# Patient Record
Sex: Female | Born: 1990 | Race: Black or African American | Hispanic: No | Marital: Single | Smoking: Never smoker
Health system: Southern US, Community
[De-identification: ages and names within clinical notes are randomized; demographics above are authoritative.]

---
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
## 2012-02-22 ENCOUNTER — Emergency Department (INDEPENDENT_AMBULATORY_CARE_PROVIDER_SITE_OTHER)
Admission: EM | Admit: 2012-02-22 | Discharge: 2012-02-22 | Disposition: A | Discharge status: Home / Self Care | Payer: Self-pay | Source: Home / Self Care

## 2012-02-22 ENCOUNTER — Encounter (HOSPITAL_COMMUNITY): Payer: Self-pay

## 2012-02-22 DIAGNOSIS — B373 Candidiasis of vulva and vagina: Secondary | ICD-10-CM

## 2012-02-22 LAB — POCT URINALYSIS DIP (DEVICE)
Glucose, UA: NEGATIVE mg/dL
Ketones, ur: NEGATIVE mg/dL
Protein, ur: 30 mg/dL — AB
Specific Gravity, Urine: 1.025 (ref 1.005–1.030)
Urobilinogen, UA: 0.2 mg/dL (ref 0.0–1.0)

## 2012-02-22 LAB — POCT PREGNANCY, URINE: Preg Test, Ur: NEGATIVE

## 2012-02-22 LAB — WET PREP, GENITAL
Trich, Wet Prep: NONE SEEN
Yeast Wet Prep HPF POC: NONE SEEN

## 2012-02-22 MED ORDER — FLUCONAZOLE 150 MG PO TABS: 150.0000 mg | ORAL_TABLET | Freq: Once | ORAL | Status: DC

## 2012-02-22 NOTE — ED Provider Notes (Signed)
History     CSN: [id number]  Arrival date & time 02/22/12  0856   None     No chief complaint on file.   (Consider location/radiation/quality/duration/timing/severity/associated sxs/prior treatment) Patient is a 21 y.o. female presenting with vaginal discharge. The history is provided by the patient. No language interpreter was used.  Vaginal Discharge This is a new problem. The current episode started more than 2 days ago. The problem occurs constantly. The problem has been gradually worsening. Nothing aggravates the symptoms. She has tried nothing for the symptoms.  Pt recently finished augmentin for a sinus infection.  Pt complains of vaginal irritation and discharge.  Pt is not sexually active  No past medical history on file.  No past surgical history on file.  No family history on file.  History  Substance Use Topics  . Smoking status: Not on file  . Smokeless tobacco: Not on file  . Alcohol Use: Not on file    OB History    No data available      Review of Systems  Genitourinary: Positive for vaginal discharge.  All other systems reviewed and are negative.    Allergies  Review of patient's allergies indicates not on file.  Home Medications  No current outpatient prescriptions on file.  BP 107/73  Pulse 75  Temp 98.2 F (36.8 C) (Oral)  Resp 16  SpO2 99%  Physical Exam  Nursing note and vitals reviewed. Constitutional: She is oriented to person, place, and time. She appears well-developed and well-nourished.  HENT:  Head: Normocephalic and atraumatic.  Eyes: Conjunctivae normal and EOM are normal. Pupils are equal, round, and reactive to light.  Neck: Normal range of motion. Neck supple.  Cardiovascular: Normal rate and normal heart sounds.   Pulmonary/Chest: Effort normal and breath sounds normal.  Abdominal: Soft. Bowel sounds are normal.  Genitourinary: Uterus normal. Vaginal discharge found.  Musculoskeletal: Normal range of motion.    Neurological: She is alert and oriented to person, place, and time. She has normal reflexes.  Skin: Skin is warm.  Psychiatric: She has a normal mood and affect.    ED Course  Procedures (including critical care time)  Labs Reviewed - No data to display No results found.   No diagnosis found.    MDM  discharge looks like yeast,  I will treat with diflucan        Elson Areas, [place] 02/22/12 1003

## 2012-02-22 NOTE — ED Provider Notes (Signed)
Orc.p  Jimmie Molly, MD 02/22/12 1252

## 2012-02-22 NOTE — ED Notes (Signed)
C/o vaginal irritation, stated one week ago states was recently prescribed Amox 875

## 2012-02-23 LAB — GC/CHLAMYDIA PROBE AMP, GENITAL
Chlamydia, DNA Probe: NEGATIVE
GC Probe Amp, Genital: NEGATIVE

## 2012-02-27 ENCOUNTER — Telehealth (HOSPITAL_COMMUNITY): Payer: Self-pay | Admitting: *Deleted

## 2012-02-27 NOTE — ED Notes (Signed)
10/4. GC/Chlamydia neg., Wet prep: few clue cells, WBC's TNTC.  Pt. treated with Diflucan.  Lab shown to Lannie Fields NP and she said to call pt. for clinical improvement. If not improved call in Metrogel 0.75% 1 appl. HS x 5 nights. Vassie Moselle 02/27/2012

## 2012-02-27 NOTE — ED Notes (Signed)
I called and left a message for pt. to call. Pt. needs Rx. of Metrogel if still symptomatic. Katherine Fischer 02/27/2012

## 2012-02-28 ENCOUNTER — Telehealth (HOSPITAL_COMMUNITY): Payer: Self-pay | Admitting: *Deleted

## 2012-03-07 ENCOUNTER — Telehealth (HOSPITAL_COMMUNITY): Payer: Self-pay | Admitting: *Deleted

## 2012-03-12 NOTE — ED Notes (Signed)
Patient returned call from letter message. Denies discomfort at present. Was in formed her results were all negative, but there is a RX available to her if she was still having syx. Pt states she is not having syx now

## 2012-04-05 ENCOUNTER — Emergency Department (INDEPENDENT_AMBULATORY_CARE_PROVIDER_SITE_OTHER)
Admission: EM | Admit: 2012-04-05 | Discharge: 2012-04-05 | Disposition: A | Discharge status: Home / Self Care | Payer: Self-pay | Source: Home / Self Care

## 2012-04-05 ENCOUNTER — Encounter (HOSPITAL_COMMUNITY): Payer: Self-pay | Admitting: Emergency Medicine

## 2012-04-05 DIAGNOSIS — T169XXA Foreign body in ear, unspecified ear, initial encounter: Secondary | ICD-10-CM

## 2012-04-05 NOTE — ED Provider Notes (Signed)
History     CSN: [id number]  Arrival date & time 04/05/12  1918   None     Chief Complaint  Patient presents with  . foregin object in ear     (Consider location/radiation/quality/duration/timing/severity/associated sxs/prior treatment) HPI Comments: 21 year old female was cleaning her ears out with a Q-tip and when she removed the Q-tip the cotton head had dislodged from the stick and remained in the right ear canal. The foreign body was noticeable in the right year and was causing some minor discomfort.   No past medical history on file.  No past surgical history on file.  No family history on file.  History  Substance Use Topics  . Smoking status: Never Smoker   . Smokeless tobacco: Not on file  . Alcohol Use: No    OB History    Grav Para Term Preterm Abortions TAB SAB Ect Mult Living                  Review of Systems  All other systems reviewed and are negative.    Allergies  Review of patient's allergies indicates no known allergies.  Home Medications   Current Outpatient Rx  Name  Route  Sig  Dispense  Refill  . FLUCONAZOLE 150 MG PO TABS   Oral   Take 1 tablet (150 mg total) by mouth once.   1 tablet   1     BP 94/60  Pulse 79  Temp 98.8 F (37.1 C) (Oral)  Resp 16  SpO2 100%  Physical Exam  Constitutional: She is oriented to person, place, and time. She appears well-developed and well-nourished. No distress.  HENT:  Head: Normocephalic and atraumatic.  Mouth/Throat: Oropharynx is clear and moist. No oropharyngeal exudate.       Left TM normal. Right TM partially obstructed to 2 white cotton foreign body.  Neck: Normal range of motion. Neck supple.  Pulmonary/Chest: Effort normal.  Musculoskeletal: Normal range of motion.  Neurological: She is alert and oriented to person, place, and time.  Skin: Skin is warm and dry.  Psychiatric: She has a normal mood and affect.    ED Course  Procedures (including critical care time)  Labs  Reviewed - No data to display No results found.   1. Foreign body in ear       MDM  Irrigated the right ear. Post irrigation the ear canal is clear of foreign body has been removed and the TM is intact. No hematoma tympanic however there is some erythema to 2 irrigation. She says she feels a lot better has no pain and she is ready to go.        Hayden Rasmussen, NP 04/05/12 2201

## 2012-04-05 NOTE — ED Notes (Signed)
Reports today when she was digging in ear with q-tip the cotton piece broke off.   Denies pain and any drainage.

## 2012-04-06 NOTE — ED Provider Notes (Signed)
Medical screening examination/treatment/procedure(s) were performed by resident physician or non-physician practitioner and as supervising physician I was immediately available for consultation/collaboration.   Alliyah Roesler DOUGLAS MD.    Jameria Bradway D Jionni Helming, MD 04/06/12 0919 

## 2012-05-24 ENCOUNTER — Emergency Department (HOSPITAL_COMMUNITY)
Admission: EM | Admit: 2012-05-24 | Discharge: 2012-05-24 | Disposition: A | Discharge status: Home / Self Care | Payer: Self-pay | Attending: Emergency Medicine | Admitting: Emergency Medicine

## 2012-05-24 ENCOUNTER — Encounter (HOSPITAL_COMMUNITY): Payer: Self-pay

## 2012-05-24 DIAGNOSIS — J111 Influenza due to unidentified influenza virus with other respiratory manifestations: Secondary | ICD-10-CM

## 2012-05-24 DIAGNOSIS — IMO0001 Reserved for inherently not codable concepts without codable children: Secondary | ICD-10-CM | POA: Insufficient documentation

## 2012-05-24 DIAGNOSIS — R509 Fever, unspecified: Secondary | ICD-10-CM | POA: Insufficient documentation

## 2012-05-24 DIAGNOSIS — J3489 Other specified disorders of nose and nasal sinuses: Secondary | ICD-10-CM | POA: Insufficient documentation

## 2012-05-24 DIAGNOSIS — R51 Headache: Secondary | ICD-10-CM | POA: Insufficient documentation

## 2012-05-24 DIAGNOSIS — R52 Pain, unspecified: Secondary | ICD-10-CM | POA: Insufficient documentation

## 2012-05-24 DIAGNOSIS — R112 Nausea with vomiting, unspecified: Secondary | ICD-10-CM | POA: Insufficient documentation

## 2012-05-24 DIAGNOSIS — R197 Diarrhea, unspecified: Secondary | ICD-10-CM | POA: Insufficient documentation

## 2012-05-24 DIAGNOSIS — Z3202 Encounter for pregnancy test, result negative: Secondary | ICD-10-CM | POA: Insufficient documentation

## 2012-05-24 LAB — BASIC METABOLIC PANEL
CO2: 20 mEq/L (ref 19–32)
Chloride: 98 mEq/L (ref 96–112)
GFR calc Af Amer: >90 mL/min (ref >90)
Potassium: 3.6 mEq/L (ref 3.5–5.1)

## 2012-05-24 LAB — CBC WITH DIFFERENTIAL/PLATELET
Basophils Absolute: 0 10*3/uL (ref 0.0–0.1)
Basophils Relative: 0 % (ref 0–1)
HCT: 37.3 % (ref 36.0–46.0)
Hemoglobin: 12 g/dL (ref 12.0–15.0)
Lymphocytes Relative: 4 % — ABNORMAL LOW (ref 12–46)
MCHC: 32.2 g/dL (ref 30.0–36.0)
Monocytes Absolute: 0.3 10*3/uL (ref 0.1–1.0)
Neutro Abs: 8.2 10*3/uL — ABNORMAL HIGH (ref 1.7–7.7)
Neutrophils Relative %: 93 % — ABNORMAL HIGH (ref 43–77)
RDW: 13.6 % (ref 11.5–15.5)
WBC: 8.9 10*3/uL (ref 4.0–10.5)

## 2012-05-24 LAB — URINALYSIS, ROUTINE W REFLEX MICROSCOPIC
Glucose, UA: NEGATIVE mg/dL
Leukocytes, UA: NEGATIVE
Nitrite: NEGATIVE
Specific Gravity, Urine: 1.033 — ABNORMAL HIGH (ref 1.005–1.030)
pH: 6 (ref 5.0–8.0)

## 2012-05-24 LAB — POCT PREGNANCY, URINE: Preg Test, Ur: NEGATIVE

## 2012-05-24 LAB — URINE MICROSCOPIC-ADD ON

## 2012-05-24 LAB — LIPASE, BLOOD: Lipase: 16 U/L (ref 11–59)

## 2012-05-24 MED ORDER — PROMETHAZINE HCL 25 MG PO TABS
25.0000 mg | ORAL_TABLET | Freq: Four times a day (QID) | ORAL | Status: DC | PRN
Start: 2012-05-24 — End: 2013-05-29

## 2012-05-24 MED ORDER — ONDANSETRON HCL 4 MG/2ML IJ SOLN
4.0000 mg | Freq: Once | INTRAMUSCULAR | Status: AC
  Administered 2012-05-24: 4 mg via INTRAVENOUS
  Filled 2012-05-24: qty 2

## 2012-05-24 MED ORDER — DIPHENOXYLATE-ATROPINE 2.5-0.025 MG PO TABS: 1.0000 | ORAL_TABLET | Freq: Four times a day (QID) | ORAL | Status: DC | PRN

## 2012-05-24 MED ORDER — SODIUM CHLORIDE 0.9 % IV BOLUS (SEPSIS)
1000.0000 mL | Freq: Once | INTRAVENOUS | Status: AC
  Administered 2012-05-24: 1000 mL via INTRAVENOUS

## 2012-05-24 MED ORDER — IBUPROFEN 800 MG PO TABS: 800.0000 mg | ORAL_TABLET | Freq: Three times a day (TID) | ORAL | Status: DC

## 2012-05-24 NOTE — ED Provider Notes (Signed)
History     CSN: [id number]  Arrival date & time 05/24/12  1002   First MD Initiated Contact with Patient 05/24/12 1202      No chief complaint on file.   (Consider location/radiation/quality/duration/timing/severity/associated sxs/prior treatment) HPI Comments: Patient comes to the ER for evaluation of flulike symptoms. Patient started to get sick about 3 or 4 days ago. She says initially she had sinus congestion. She then developed headache and cough. She has been experiencing fever, chills and sweats. Last night, however, she developed nausea, vomiting and diarrhea. She has taken over-the-counter cold and flu medications without any relief. She reports that the nausea and vomiting has now resolved, she has drank some ginger ale but her stomach is still queasy.   History reviewed. No pertinent past medical history.  History reviewed. No pertinent past surgical history.  No family history on file.  History  Substance Use Topics  . Smoking status: Never Smoker   . Smokeless tobacco: Not on file  . Alcohol Use: No    OB History    Grav Para Term Preterm Abortions TAB SAB Ect Mult Living                  Review of Systems  Constitutional: Positive for fever and chills.  HENT: Positive for congestion.   Respiratory: Positive for cough.   Gastrointestinal: Positive for nausea, vomiting and diarrhea.  Musculoskeletal: Positive for myalgias.  Neurological: Positive for headaches.  All other systems reviewed and are negative.    Allergies  Review of patient's allergies indicates no known allergies.  Home Medications  No current outpatient prescriptions on file.  BP 120/66  Pulse 115  Temp 99.7 F (37.6 C) (Oral)  Resp 20  SpO2 99%  LMP 05/06/2012  Physical Exam  Constitutional: She is oriented to person, place, and time. She appears well-developed and well-nourished. No distress.  HENT:  Head: Normocephalic and atraumatic.  Right Ear: Hearing normal.  Nose:  Nose normal.  Mouth/Throat: Oropharynx is clear and moist and mucous membranes are normal.  Eyes: Conjunctivae normal and EOM are normal. Pupils are equal, round, and reactive to light.  Neck: Normal range of motion. Neck supple.  Cardiovascular: Normal rate, regular rhythm, S1 normal and S2 normal.  Exam reveals no gallop and no friction rub.   No murmur heard. Pulmonary/Chest: Effort normal and breath sounds normal. No respiratory distress. She exhibits no tenderness.  Abdominal: Soft. Normal appearance and bowel sounds are normal. There is no hepatosplenomegaly. There is no tenderness. There is no rebound, no guarding, no tenderness at McBurney's point and negative Murphy's sign. No hernia.  Musculoskeletal: Normal range of motion.  Neurological: She is alert and oriented to person, place, and time. She has normal strength. No cranial nerve deficit or sensory deficit. Coordination normal. GCS eye subscore is 4. GCS verbal subscore is 5. GCS motor subscore is 6.  Skin: Skin is warm, dry and intact. No rash noted. No cyanosis.  Psychiatric: She has a normal mood and affect. Her speech is normal and behavior is normal. Thought content normal.    ED Course  Procedures (including critical care time)  Labs Reviewed  CBC WITH DIFFERENTIAL - Abnormal; Notable for the following:    Neutrophils Relative 93 (*)     Neutro Abs 8.2 (*)     Lymphocytes Relative 4 (*)     Lymphs Abs 0.4 (*)     All other components within normal limits  BASIC METABOLIC PANEL - Abnormal;  Notable for the following:    Glucose, Bld 121 (*)     All other components within normal limits  URINALYSIS, ROUTINE W REFLEX MICROSCOPIC - Abnormal; Notable for the following:    Specific Gravity, Urine 1.033 (*)     Hgb urine dipstick MODERATE (*)     Ketones, ur 40 (*)     All other components within normal limits  LIPASE, BLOOD  POCT PREGNANCY, URINE  URINE MICROSCOPIC-ADD ON   No results found.   1. Flu syndrome        MDM  Patient presents to the ER for evaluation of flu symptoms. This has been sick for 4 or 5 days. She started with upper respiratory infection symptoms and now has developed nausea, vomiting and diarrhea. Patient reports weakness and inability to hold anything down. She was hydrated with a liter normal saline and is feeling better. Labs were unremarkable. Examination was unremarkable. Patient will be discharged with continued symptomatic treatment.        Gilda Crease, MD 05/24/12 1336

## 2012-05-24 NOTE — ED Notes (Signed)
Family at bedside. 

## 2012-05-24 NOTE — ED Notes (Signed)
Pt presents with 3-4 day h/o generalizes body aches, sinus pressure and headache.  Pt reports epigastric pain with nausea, vomiting and diarrhea that began last night. +chills and sweats;  Alka seltzer, tylenol cold taken without relief.

## 2013-02-21 ENCOUNTER — Emergency Department (INDEPENDENT_AMBULATORY_CARE_PROVIDER_SITE_OTHER)
Admission: EM | Admit: 2013-02-21 | Discharge: 2013-02-21 | Disposition: A | Discharge status: Home / Self Care | Payer: Medicaid Other | Source: Home / Self Care | Attending: Emergency Medicine | Admitting: Emergency Medicine

## 2013-02-21 ENCOUNTER — Encounter (HOSPITAL_COMMUNITY): Payer: Self-pay | Admitting: Emergency Medicine

## 2013-02-21 DIAGNOSIS — J069 Acute upper respiratory infection, unspecified: Secondary | ICD-10-CM

## 2013-02-21 MED ORDER — SALINE SPRAY 0.65 % NA SOLN: 1.0000 | NASAL | Status: DC | PRN

## 2013-02-21 MED ORDER — BENZONATATE 100 MG PO CAPS: 100.0000 mg | ORAL_CAPSULE | Freq: Three times a day (TID) | ORAL | Status: DC

## 2013-02-21 NOTE — ED Provider Notes (Signed)
Medical screening examination/treatment/procedure(s) were performed by non-physician practitioner and as supervising physician I was immediately available for consultation/collaboration.  Rigby Swamy, M.D.  Dorraine Ellender C Keymon Mcelroy, MD 02/21/13 1509 

## 2013-02-21 NOTE — ED Provider Notes (Signed)
CSN: [id number]     Arrival date & time 02/21/13  1010 History   First MD Initiated Contact with Patient 02/21/13 1118     No chief complaint on file.  (Consider location/radiation/quality/duration/timing/severity/associated sxs/prior Treatment) Patient is a 22 y.o. female presenting with URI. The history is provided by the patient.  URI Presenting symptoms: congestion, cough, rhinorrhea and sore throat   Presenting symptoms: no ear pain and no fever   Severity:  Moderate Onset quality:  Gradual Duration:  5 days Timing:  Constant Progression:  Unchanged Chronicity:  New Relieved by:  Nothing Exacerbated by: lying down- can't because of coughing. Ineffective treatments:  OTC medications Associated symptoms: no sinus pain, no swollen glands and no wheezing     History reviewed. No pertinent past medical history. History reviewed. No pertinent past surgical history. History reviewed. No pertinent family history. History  Substance Use Topics  . Smoking status: Never Smoker   . Smokeless tobacco: Not on file  . Alcohol Use: No   OB History   Grav Para Term Preterm Abortions TAB SAB Ect Mult Living                 Review of Systems  Constitutional: Negative for fever and chills.  HENT: Positive for congestion, sore throat, rhinorrhea and postnasal drip. Negative for ear pain and sinus pressure.   Respiratory: Positive for cough. Negative for shortness of breath and wheezing.     Allergies  Review of patient's allergies indicates no known allergies.  Home Medications   Current Outpatient Rx  Name  Route  Sig  Dispense  Refill  . benzonatate (TESSALON) 100 MG capsule   Oral   Take 1 capsule (100 mg total) by mouth every 8 (eight) hours.   21 capsule   0   . diphenoxylate-atropine (LOMOTIL) 2.5-0.025 MG per tablet   Oral   Take 1 tablet by mouth 4 (four) times daily as needed for diarrhea or loose stools.   30 tablet   0   . ibuprofen (ADVIL,MOTRIN) 800 MG  tablet   Oral   Take 1 tablet (800 mg total) by mouth 3 (three) times daily.   21 tablet   0   . promethazine (PHENERGAN) 25 MG tablet   Oral   Take 1 tablet (25 mg total) by mouth every 6 (six) hours as needed for nausea.   30 tablet   0   . sodium chloride (OCEAN) 0.65 % SOLN nasal spray   Nasal   Place 1 spray into the nose as needed for congestion.   1 Bottle   0    BP 119/55  Pulse 111  Temp(Src) 98.5 F (36.9 C) (Oral)  Resp 18  SpO2 100% Physical Exam  Constitutional: She appears well-developed and well-nourished. She appears ill. No distress.  HENT:  Right Ear: Tympanic membrane, external ear and ear canal normal.  Left Ear: Tympanic membrane, external ear and ear canal normal.  Nose: Mucosal edema and rhinorrhea present. Right sinus exhibits no maxillary sinus tenderness and no frontal sinus tenderness. Left sinus exhibits no maxillary sinus tenderness and no frontal sinus tenderness.  Mouth/Throat: Oropharynx is clear and moist and mucous membranes are normal.  Cardiovascular: Normal rate and regular rhythm.   Pt is not tachycardic on my exam  Pulmonary/Chest: Effort normal and breath sounds normal.  No coughing observed during exam  Lymphadenopathy:       Head (right side): No submental, no submandibular and no tonsillar adenopathy present.  Head (left side): No submental, no submandibular and no tonsillar adenopathy present.    She has no cervical adenopathy.    ED Course  Procedures (including critical care time) Labs Review Labs Reviewed - No data to display Imaging Review No results found.  MDM   1. URI (upper respiratory infection)   suggested afrin nasal spray. Rx saline nasal spray prn #1 bottle. Rx tessalon perles 100mg  TID prn cough #21     Cathlyn Parsons, NP 02/21/13 1124

## 2013-02-21 NOTE — ED Notes (Signed)
Cough and runny nose for onset last week friday

## 2013-03-25 ENCOUNTER — Emergency Department (INDEPENDENT_AMBULATORY_CARE_PROVIDER_SITE_OTHER)
Admission: EM | Admit: 2013-03-25 | Discharge: 2013-03-25 | Disposition: A | Discharge status: Home / Self Care | Payer: Medicaid Other | Source: Home / Self Care | Attending: Family Medicine | Admitting: Family Medicine

## 2013-03-25 ENCOUNTER — Encounter (HOSPITAL_COMMUNITY): Payer: Self-pay | Admitting: Emergency Medicine

## 2013-03-25 DIAGNOSIS — J020 Streptococcal pharyngitis: Secondary | ICD-10-CM

## 2013-03-25 MED ORDER — AMOXICILLIN 875 MG PO TABS: 875.0000 mg | ORAL_TABLET | Freq: Two times a day (BID) | ORAL | Status: DC

## 2013-03-25 NOTE — ED Provider Notes (Signed)
Medical screening examination/treatment/procedure(s) were performed by a resident physician or non-physician practitioner and as the supervising physician I was immediately available for consultation/collaboration.  Clementeen Graham, MD    Rodolph Bong, MD 03/25/13 [phone]

## 2013-03-25 NOTE — ED Notes (Signed)
Sore throat, headaches, body aches, shivers, onset Saturday 11/1

## 2013-03-25 NOTE — ED Provider Notes (Signed)
CSN: [id number]     Arrival date & time 03/25/13  1027 History   First MD Initiated Contact with Patient 03/25/13 1135     Chief Complaint  Patient presents with  . Sore Throat   (Consider location/radiation/quality/duration/timing/severity/associated sxs/prior Treatment) Patient is a 22 y.o. female presenting with pharyngitis. The history is provided by the patient.  Sore Throat This is a new problem. The current episode started 2 days ago. The problem occurs constantly. The problem has been gradually worsening. Associated symptoms include headaches. The symptoms are aggravated by swallowing. Nothing relieves the symptoms. She has tried nothing for the symptoms.    History reviewed. No pertinent past medical history. History reviewed. No pertinent past surgical history. History reviewed. No pertinent family history. History  Substance Use Topics  . Smoking status: Never Smoker   . Smokeless tobacco: Not on file  . Alcohol Use: No   OB History   Grav Para Term Preterm Abortions TAB SAB Ect Mult Living                 Review of Systems  Constitutional: Positive for fever and chills.  HENT: Positive for sore throat. Negative for congestion and postnasal drip.   Neurological: Positive for headaches.    Allergies  Review of patient's allergies indicates no known allergies.  Home Medications   Current Outpatient Rx  Name  Route  Sig  Dispense  Refill  . acetaminophen (TYLENOL) 325 MG tablet   Oral   Take 650 mg by mouth every 6 (six) hours as needed for pain.         Marland Kitchen amoxicillin (AMOXIL) 875 MG tablet   Oral   Take 1 tablet (875 mg total) by mouth 2 (two) times daily.   20 tablet   0   . benzonatate (TESSALON) 100 MG capsule   Oral   Take 1 capsule (100 mg total) by mouth every 8 (eight) hours.   21 capsule   0   . dextromethorphan 15 MG/5ML syrup   Oral   Take 10 mLs by mouth 4 (four) times daily as needed for cough.         . diphenoxylate-atropine  (LOMOTIL) 2.5-0.025 MG per tablet   Oral   Take 1 tablet by mouth 4 (four) times daily as needed for diarrhea or loose stools.   30 tablet   0   . ibuprofen (ADVIL,MOTRIN) 800 MG tablet   Oral   Take 1 tablet (800 mg total) by mouth 3 (three) times daily.   21 tablet   0   . promethazine (PHENERGAN) 25 MG tablet   Oral   Take 1 tablet (25 mg total) by mouth every 6 (six) hours as needed for nausea.   30 tablet   0   . sodium chloride (OCEAN) 0.65 % SOLN nasal spray   Nasal   Place 1 spray into the nose as needed for congestion.   1 Bottle   0    BP 120/54  Pulse 113  Temp(Src) 100.4 F (38 C) (Oral)  Resp 16  SpO2 100%  LMP 03/18/2013 Physical Exam  Constitutional: She appears well-developed and well-nourished. No distress.  HENT:  Right Ear: Tympanic membrane, external ear and ear canal normal.  Left Ear: Tympanic membrane, external ear and ear canal normal.  Nose: No mucosal edema.  Mouth/Throat: Oropharyngeal exudate, posterior oropharyngeal edema and posterior oropharyngeal erythema present.  Cardiovascular: Normal rate and regular rhythm.   Pulmonary/Chest: Effort normal and breath sounds  normal.    ED Course  Procedures (including critical care time) Labs Review Labs Reviewed  POCT RAPID STREP A ([facility] ONLY) - Abnormal; Notable for the following:    Streptococcus, Group A Screen (Direct) POSITIVE (*)    All other components within normal limits   Imaging Review No results found.  EKG Interpretation     Ventricular Rate:    PR Interval:    QRS Duration:   QT Interval:    QTC Calculation:   R Axis:     Text Interpretation:              MDM   1. Strep pharyngitis   strep screen pos. Rx amoxicillin 875mg  po BID #20.      Cathlyn Parsons, NP 03/25/13 1150

## 2013-05-29 ENCOUNTER — Encounter (HOSPITAL_COMMUNITY): Payer: Self-pay | Admitting: Emergency Medicine

## 2013-05-29 ENCOUNTER — Emergency Department (INDEPENDENT_AMBULATORY_CARE_PROVIDER_SITE_OTHER)
Admission: EM | Admit: 2013-05-29 | Discharge: 2013-05-29 | Disposition: A | Discharge status: Home / Self Care | Payer: Medicaid Other | Source: Home / Self Care | Attending: Family Medicine | Admitting: Family Medicine

## 2013-05-29 DIAGNOSIS — R21 Rash and other nonspecific skin eruption: Secondary | ICD-10-CM

## 2013-05-29 MED ORDER — PREDNISONE 5 MG PO KIT
PACK | ORAL | Status: DC
Start: 2013-05-29 — End: 2017-04-10

## 2013-05-29 MED ORDER — TRIAMCINOLONE 0.1 % CREAM:EUCERIN CREAM 1:1: 1.0000 "application " | TOPICAL_CREAM | Freq: Two times a day (BID) | CUTANEOUS | Status: DC | PRN

## 2013-05-29 NOTE — ED Provider Notes (Signed)
Katherine Fischer is a 23 y.o. female who presents to Urgent Care today for rash present for the past 2-3 days. The rash spread over both upper lower extremities and across her trunk. She describes small pruritic erythematous papules. She denies any new medications or detergents soaps etc. She denies any mucocutaneous involvement, or flulike illness. She's tried some hydrocortisone intermittently which didn't seem to help much. Additionally she's been taking Benadryl at night to help sleep which helps. No fevers or chills nausea vomiting or diarrhea. She has never had anything like this before.   History reviewed. No pertinent past medical history. History  Substance Use Topics  . Smoking status: Never Smoker   . Smokeless tobacco: Not on file  . Alcohol Use: No   ROS as above Medications reviewed. No current facility-administered medications for this encounter.   Current Outpatient Prescriptions  Medication Sig Dispense Refill  . acetaminophen (TYLENOL) 325 MG tablet Take 650 mg by mouth every 6 (six) hours as needed for pain.      . PredniSONE 5 MG KIT 12 day kit po  1 kit  0  . Triamcinolone Acetonide (TRIAMCINOLONE 0.1 % CREAM : EUCERIN) CREA Apply 1 application topically 2 (two) times daily as needed for rash. Disp 1 pound jar  1 each  1  . [DISCONTINUED] promethazine (PHENERGAN) 25 MG tablet Take 1 tablet (25 mg total) by mouth every 6 (six) hours as needed for nausea.  30 tablet  0  . [DISCONTINUED] sodium chloride (OCEAN) 0.65 % SOLN nasal spray Place 1 spray into the nose as needed for congestion.  1 Bottle  0    Exam:  BP 109/61  Pulse 74  Temp(Src) 98.5 F (36.9 C) (Oral)  Resp 18  SpO2 98% Gen: Well NAD HEENT: EOMI,  MMM, no mucocutaneous involvement Lungs: Normal work of breathing. CTABL Heart: RRR no MRG Exts:, warm and well perfused.  Skin: Groups of very small erythematous papules with some excoriation on the extremities and trunk. They tend to congregate along the  forearms and stretch marks of the upper extremities back and lower extremities. She does not have much involvement of her face. Nontender and blanchable.    Assessment and Plan: 23 y.o. female with rash. Unclear etiology. I think this is likely contact dermatitis however it is impossible to tell. This may be some intrinsic issue.  As she has significant involvement and has failed over-the-counter hydrocortisone I will use a prednisone taper. I will use 5 mg 12 day prednisone dose pack.  Additionally we'll treat with topical Eucerin and triamcinolone cream. We'll use Gold Bond Itch and Benadryl for symptomatic control temporarily.  Followup back here or with dermatology if not improving.    Discussed warning signs or symptoms. Please see discharge instructions. Patient expresses understanding.    Gregor Hams, MD 05/29/13 [phone]

## 2013-05-29 NOTE — ED Notes (Signed)
Rash on hands, itching back of legs, neck since Monday; no one else is affected

## 2013-05-29 NOTE — Discharge Instructions (Signed)
Thank you for coming in today. Take prednisone as directed for 12 days.  Use the tub of cream twice daily for itching.  Take 1-2 benadryl at night for itching as needed.  Apply Gold Bond Itch as needed for temporary control of itching.  Call or go to the emergency room if you get worse, have trouble breathing, have chest pains, or palpitations.   Contact Dermatitis Contact dermatitis is a reaction to certain substances that touch the skin. Contact dermatitis can be either irritant contact dermatitis or allergic contact dermatitis. Irritant contact dermatitis does not require previous exposure to the substance for a reaction to occur.Allergic contact dermatitis only occurs if you have been exposed to the substance before. Upon a repeat exposure, your body reacts to the substance.  CAUSES  Many substances can cause contact dermatitis. Irritant dermatitis is most commonly caused by repeated exposure to mildly irritating substances, such as:  Makeup.  Soaps.  Detergents.  Bleaches.  Acids.  Metal salts, such as nickel. Allergic contact dermatitis is most commonly caused by exposure to:  Poisonous plants.  Chemicals (deodorants, shampoos).  Jewelry.  Latex.  Neomycin in triple antibiotic cream.  Preservatives in products, including clothing. SYMPTOMS  The area of skin that is exposed may develop:  Dryness or flaking.  Redness.  Cracks.  Itching.  Pain or a burning sensation.  Blisters. With allergic contact dermatitis, there may also be swelling in areas such as the eyelids, mouth, or genitals.  DIAGNOSIS  Your caregiver can usually tell what the problem is by doing a physical exam. In cases where the cause is uncertain and an allergic contact dermatitis is suspected, a patch skin test may be performed to help determine the cause of your dermatitis. TREATMENT Treatment includes protecting the skin from further contact with the irritating substance by avoiding that  substance if possible. Barrier creams, powders, and gloves may be helpful. Your caregiver may also recommend:  Steroid creams or ointments applied 2 times daily. For best results, soak the rash area in cool water for 20 minutes. Then apply the medicine. Cover the area with a plastic wrap. You can store the steroid cream in the refrigerator for a "chilly" effect on your rash. That may decrease itching. Oral steroid medicines may be needed in more severe cases.  Antibiotics or antibacterial ointments if a skin infection is present.  Antihistamine lotion or an antihistamine taken by mouth to ease itching.  Lubricants to keep moisture in your skin.  Burow's solution to reduce redness and soreness or to dry a weeping rash. Mix one packet or tablet of solution in 2 cups cool water. Dip a clean washcloth in the mixture, wring it out a bit, and put it on the affected area. Leave the cloth in place for 30 minutes. Do this as often as possible throughout the day.  Taking several cornstarch or baking soda baths daily if the area is too large to cover with a washcloth. Harsh chemicals, such as alkalis or acids, can cause skin damage that is like a burn. You should flush your skin for 15 to 20 minutes with cold water after such an exposure. You should also seek immediate medical care after exposure. Bandages (dressings), antibiotics, and pain medicine may be needed for severely irritated skin.  HOME CARE INSTRUCTIONS  Avoid the substance that caused your reaction.  Keep the area of skin that is affected away from hot water, soap, sunlight, chemicals, acidic substances, or anything else that would irritate your skin.  Do not scratch the rash. Scratching may cause the rash to become infected.  You may take cool baths to help stop the itching.  Only take over-the-counter or prescription medicines as directed by your caregiver.  See your caregiver for follow-up care as directed to make sure your skin is  healing properly. SEEK MEDICAL CARE IF:   Your condition is not better after 3 days of treatment.  You seem to be getting worse.  You see signs of infection such as swelling, tenderness, redness, soreness, or warmth in the affected area.  You have any problems related to your medicines. Document Released: 05/06/2000 Document Revised: 08/01/2011 Document Reviewed: 10/12/2010 [facility] Patient Information 2014 [place], [place].

## 2015-08-13 ENCOUNTER — Ambulatory Visit: Payer: Medicaid Other | Attending: Internal Medicine

## 2015-11-17 ENCOUNTER — Ambulatory Visit: Payer: Medicaid Other | Admitting: Orthopaedic Surgery

## 2015-11-19 ENCOUNTER — Other Ambulatory Visit (HOSPITAL_COMMUNITY): Payer: Self-pay | Admitting: Sports Medicine

## 2015-11-19 DIAGNOSIS — M25561 Pain in right knee: Secondary | ICD-10-CM

## 2015-11-30 ENCOUNTER — Ambulatory Visit (HOSPITAL_COMMUNITY)
Admission: RE | Admit: 2015-11-30 | Discharge: 2015-11-30 | Disposition: A | Discharge status: Home / Self Care | Payer: Medicaid Other | Source: Ambulatory Visit | Attending: Sports Medicine | Admitting: Sports Medicine

## 2015-11-30 DIAGNOSIS — M25461 Effusion, right knee: Secondary | ICD-10-CM | POA: Insufficient documentation

## 2015-11-30 DIAGNOSIS — M25561 Pain in right knee: Secondary | ICD-10-CM | POA: Insufficient documentation

## 2015-11-30 DIAGNOSIS — S76111A Strain of right quadriceps muscle, fascia and tendon, initial encounter: Secondary | ICD-10-CM | POA: Insufficient documentation

## 2015-11-30 DIAGNOSIS — S83014A Lateral dislocation of right patella, initial encounter: Secondary | ICD-10-CM | POA: Insufficient documentation

## 2015-12-10 ENCOUNTER — Ambulatory Visit: Payer: Self-pay | Attending: Sports Medicine | Admitting: Physical Therapy

## 2015-12-10 DIAGNOSIS — M25561 Pain in right knee: Secondary | ICD-10-CM | POA: Insufficient documentation

## 2015-12-10 DIAGNOSIS — M25661 Stiffness of right knee, not elsewhere classified: Secondary | ICD-10-CM | POA: Insufficient documentation

## 2015-12-10 DIAGNOSIS — R262 Difficulty in walking, not elsewhere classified: Secondary | ICD-10-CM | POA: Insufficient documentation

## 2015-12-10 DIAGNOSIS — M6281 Muscle weakness (generalized): Secondary | ICD-10-CM | POA: Insufficient documentation
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
## 2015-12-14 ENCOUNTER — Ambulatory Visit: Payer: Self-pay | Admitting: Physical Therapy

## 2015-12-14 DIAGNOSIS — R262 Difficulty in walking, not elsewhere classified: Secondary | ICD-10-CM

## 2015-12-14 DIAGNOSIS — M6281 Muscle weakness (generalized): Secondary | ICD-10-CM

## 2015-12-14 DIAGNOSIS — M25561 Pain in right knee: Secondary | ICD-10-CM

## 2015-12-14 DIAGNOSIS — M25661 Stiffness of right knee, not elsewhere classified: Secondary | ICD-10-CM

## 2015-12-14 NOTE — Therapy (Signed)
[facility] [address] [place], [place], [address] Phone: [phone]   Fax:  [phone]  Physical Therapy Treatment  Patient Details  Name: Katherine Fischer MRN: [id number] Date of Birth: 09/08/1990 Referring Provider: Casimiro Needle rigby DO  Encounter Date: 12/14/2015      PT End of Session - 12/14/15 1318    Visit Number 2   Number of Visits 16   Date for PT Re-Evaluation 02/04/16   PT Start Time 1100   PT Stop Time 1202   PT Time Calculation (min) 62 min   Activity Tolerance Patient tolerated treatment well;Patient limited by pain   Behavior During Therapy [facility] for tasks assessed/performed      No past medical history on file.  No past surgical history on file.  There were no vitals filed for this visit.      Subjective Assessment - 12/14/15 1107    Subjective 5/10.  Ibuprophen helps.  has been tryingtto work on motion.  Having less muscle spasms.   Patient is accompained by: Family member  Mother   Pain Score 5    Pain Location Knee   Pain Orientation Right;Distal;Medial   Pain Descriptors / Indicators Aching;Sharp  rubber band   Pain Frequency Intermittent   Aggravating Factors  bending with using the bathroom (Bending)   Pain Relieving Factors ibuprophen, keeping it straight, brace            [facility] PT Assessment - 12/14/15 0001      PROM   Overall PROM Comments 35 with tape and strap                     [facility] Adult PT Treatment/Exercise - 12/14/15 0001      Knee/Hip Exercises: Seated   Other Seated Knee/Hip Exercises attempted to sit with leg hang,  and with leg supported .  painful,  guarding.      Knee/Hip Exercises: Supine   Quad Sets 2 sets;10 reps   Short Arc Quad Sets 5 reps  3 inch and 6 inch rolls.  AA   Heel Slides 10 reps;2 sets   strap, patient guiding, PTA holding patellar tendon helps     Moist Heat Therapy   Number Minutes Moist Heat --  with session and a fresh pack  added to end of session.   Moist Heat Location Knee  to assist flexibility     Manual Therapy   Manual therapy comments retrograde soft tissue work,  taping to McConnel to pull patella inferior, and kinesiotex tape for edema assist.                  PT Education - 12/14/15 1317    Education provided Yes   Education Details Edema control ideas.     Person(s) Educated Patient;Parent(s)   Methods Explanation;Demonstration;Verbal cues   Comprehension Verbalized understanding          PT Short Term Goals - 12/14/15 1324      PT SHORT TERM GOAL #1   Title Patient will ambulate 200' w/ 1 crutch without significant increase in pain    Time 4   Period Weeks   Status Unable to assess     PT SHORT TERM GOAL #2   Title Patient will be I w/ initial HEP for knee ROM and strengthening    Baseline needs assist of her Mother   Time 4   Period Weeks   Status On-going     PT  SHORT TERM GOAL #3   Title Patient will increase knee flexion to 50 degrees   Baseline AA ROM  35 degrees   Time 4   Period Weeks     PT SHORT TERM GOAL #4   Title Patient will report 3/10 pain at worst in right knee    Baseline Pain varies   Period Weeks   Status On-going           PT Long Term Goals - 12/10/15 1642      PT LONG TERM GOAL #1   Title Patient will stand for 1 hour without increased knee pain in order to go back to work    Time 8   Period Weeks   Status New     PT LONG TERM GOAL #2   Title Patient will bend to pick item up without pain 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  Distal patella tendon support and taking slack off quadriceps assisted with flexion.     PT Next Visit Plan Attmept ROM and light exercises. If patient does not progress return patient o MD.    PT Home Exercise Plan No new exercises added   Consulted and Agree with Plan of Care Patient;Family member/caregiver   Family Member Consulted Mother      Patient will benefit from skilled therapeutic intervention in order to improve the following deficits and impairments:     Visit Diagnosis: Pain in right knee  Stiffness of right knee, not elsewhere classified  Difficulty in walking, not elsewhere classified  Muscle weakness (generalized)     Problem List There are no active problems to display for this patient.   [facility] 12/14/2015, 1:26 PM  [facility] [address] [place], [place], [address] Phone: [phone]   Fax:  [phone]  Name: MARAJADE LEI MRN: [id number] Date of Birth: 1991/05/08   Liz Beach, PTA 12/14/15 1:26 PM Phone: [phone] Fax: [phone]

## 2015-12-16 ENCOUNTER — Ambulatory Visit: Payer: Self-pay | Admitting: Physical Therapy

## 2015-12-16 ENCOUNTER — Encounter: Payer: Self-pay | Admitting: Physical Therapy

## 2015-12-16 DIAGNOSIS — M25661 Stiffness of right knee, not elsewhere classified: Secondary | ICD-10-CM

## 2015-12-16 DIAGNOSIS — M6281 Muscle weakness (generalized): Secondary | ICD-10-CM

## 2015-12-16 DIAGNOSIS — R262 Difficulty in walking, not elsewhere classified: Secondary | ICD-10-CM

## 2015-12-16 DIAGNOSIS — M25561 Pain in right knee: Secondary | ICD-10-CM

## 2015-12-16 NOTE — Therapy (Signed)
[facility] [address] [place], [place], [address] Phone: [phone]   Fax:  [phone]  Physical Therapy Treatment  Patient Details  Name: Katherine Fischer MRN: [id number] Date of Birth: [birth year] Referring Provider: Casimiro Needle rigby DO  Encounter Date: 12/16/2015      PT End of Session - 12/16/15 1104    Visit Number 3   Number of Visits 16   Date for PT Re-Evaluation 02/04/16   Authorization Type CAFA expires 10/11    PT Start Time 1105   PT Stop Time 1155   PT Time Calculation (min) 50 min   Activity Tolerance Patient limited by pain   Behavior During Therapy [facility] for tasks assessed/performed      History reviewed. No pertinent past medical history.  History reviewed. No pertinent surgical history.  There were no vitals filed for this visit.      Subjective Assessment - 12/16/15 1109    Subjective has been trying to bend her knee which is difficult but has made some improvement.    Currently in Pain? Yes   Pain Score 4    Pain Location Knee   Pain Orientation Right   Pain Descriptors / Indicators Aching;Tightness                         [facility] Adult PT Treatment/Exercise - 12/16/15 0001      Ambulation/Gait   Gait Comments instructed in gait pattern with crutches     Exercises   Exercises Other Exercises     Knee/Hip Exercises: Seated   Other Seated Knee/Hip Exercises knee flexion off EOB guided by PT, 14 lb on quads   Hamstring Limitations isometric into PT hand at end range flexion     Modalities   Modalities Cryotherapy     Cryotherapy   Number Minutes Cryotherapy 10 Minutes   Cryotherapy Location Knee  R   Type of Cryotherapy Ice pack     Manual Therapy   Manual Therapy Taping   McConnell patellar inf glide with inverted "V"                PT Education - 12/16/15 1251    Education provided Yes   Education Details anatomy of condition, importance of flexion, HEP,  gait with crutches   Person(s) Educated Patient;Other (comment)  grandmother   Methods Explanation;Demonstration;Tactile cues;Verbal cues   Comprehension Verbalized understanding;Returned demonstration;Verbal cues required;Tactile cues required;Need further instruction          PT Short Term Goals - 12/14/15 1324      PT SHORT TERM GOAL #1   Title Patient will ambulate 200' w/ 1 crutch without significant increase in pain    Time 4   Period Weeks   Status Unable to assess     PT SHORT TERM GOAL #2   Title Patient will be I w/ initial HEP for knee ROM and strengthening    Baseline needs assist of her Mother   Time 4   Period Weeks   Status On-going     PT SHORT TERM GOAL #3   Title Patient will increase knee flexion to 50 degrees   Baseline AA ROM  35 degrees   Time 4   Period Weeks     PT SHORT TERM GOAL #4   Title Patient will report 3/10 pain at worst in right knee    Baseline Pain varies   Period Weeks   Status On-going  PT Long Term Goals - 12/10/15 1642      PT LONG TERM GOAL #1   Title Patient will stand for 1 hour without increased knee pain in order to go back to work    Time 8   Period Weeks   Status New     PT LONG TERM GOAL #2   Title Patient will bend to pick item up without pain in 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   Time 8   Period Weeks     PT LONG TERM GOAL #3   Title Patient will ambulate 1 mile without increased pain in order to return to work    Time 8   Period Weeks   Status New     PT LONG TERM GOAL #4   Title Patient will increase passive knee flexion toi 120 degrees in order to get up and down out of a low chair   Time 8   Period Weeks   Status New               Plan - 12/16/15 1252    Clinical Impression Statement Pt was fearful and hesitant to allow knee flexion due to fear of "popping"    Utilization of weight placed on quadriceps and pt placing pressure to patellar tendon attachment made passive flexion  pain free but pt was fearful.    PT Next Visit Plan Attmept ROM and light exercises. If patient does not progress return patient o MD.    Consulted and Agree with Plan of Care Patient;Family member/caregiver   Family Member Consulted grandmother      Patient will benefit from skilled therapeutic intervention in order to improve the following deficits and impairments:     Visit Diagnosis: Pain in right knee  Stiffness of right knee, not elsewhere classified  Difficulty in walking, not elsewhere classified  Muscle weakness (generalized)     Problem List There are no active problems to display for this patient.  Izell Labat C. Annarae Macnair PT, DPT 12/16/15 12:59 PM   [facility] [address] [place], [place], [address] Phone: [phone]   Fax:  [phone]  Name: LAKEYN SHRIVASTAVA MRN: [id number] Date of Birth: [birth year]

## 2015-12-21 ENCOUNTER — Ambulatory Visit: Payer: Self-pay | Admitting: Physical Therapy

## 2015-12-21 DIAGNOSIS — M6281 Muscle weakness (generalized): Secondary | ICD-10-CM

## 2015-12-21 DIAGNOSIS — M25561 Pain in right knee: Secondary | ICD-10-CM

## 2015-12-21 DIAGNOSIS — R262 Difficulty in walking, not elsewhere classified: Secondary | ICD-10-CM

## 2015-12-21 DIAGNOSIS — M25661 Stiffness of right knee, not elsewhere classified: Secondary | ICD-10-CM

## 2015-12-21 NOTE — Therapy (Signed)
[place], [place], [address] Phone: [phone]   Fax:  [phone]  Physical Therapy Treatment  Patient Details  Name: Katherine Fischer MRN: [id number] Date of Birth: 03/18/1991 Referring Provider: Legrand Como rigby DO  Encounter Date: 12/21/2015      PT End of Session - 12/21/15 1232    Visit Number 4   Number of Visits 16   Date for PT Re-Evaluation 02/04/16   Authorization Type CAFA expires 10/11    PT Start Time 1155   PT Stop Time 1230   PT Time Calculation (min) 35 min   Activity Tolerance Patient tolerated treatment well   Behavior During Therapy [facility] for tasks assessed/performed      No past medical history on file.  No past surgical history on file.  There were no vitals filed for this visit.      Subjective Assessment - 12/21/15 1228    Subjective Pt arriving to therapy reporting she has been trying to bend her R knee more but reports increasecd pain with movement rated 4-5/10.    Patient is accompained by: Family member   Pertinent History History of bilateral patelar instability    Limitations Standing;Lifting;Walking   How long can you sit comfortably? > 20 min    How long can you stand comfortably? > 12 minutes    How long can you walk comfortably? limited household distances    Diagnostic tests MRI: toen right medial PF retinaculaum; torn medial patellar ligament   Patient Stated Goals Get back to work; strengthen the leg so this does not happen again   Currently in Pain? Yes   Pain Score 4    Pain Location Knee   Pain Orientation Right   Pain Descriptors / Indicators Aching;Tightness   Pain Type Acute pain   Pain Onset More than a month ago   Pain Frequency Intermittent   Aggravating Factors  bending, transitioning from sit to stand and stand to sit.    Pain Relieving Factors ice, ibuprophen, keeping the knee straight   Effect of Pain on Daily Activities bending, walking   Multiple Pain Sites No            [facility] PT Assessment - 12/21/15 0001      PROM   Overall PROM Comments 48 degrees with taping and overpressure by pt                     [facility] Adult PT Treatment/Exercise - 12/21/15 0001      Ambulation/Gait   Gait Comments reviewed gait with crutches     Knee/Hip Exercises: Seated   Other Seated Knee/Hip Exercises knee flexion off EOB guided by PT, 14 lb on quads   Hamstring Limitations isometric into PT hand at end range flexion  5 reps     Knee/Hip Exercises: Supine   Quad Sets 10 reps  with knee on bolster, holding 1-2 seconds   Other Supine Knee/Hip Exercises Performed gentle soft tissue mobilization to R knee     Manual Therapy   Manual Therapy Taping   McConnell patellar inf glide with inverted "V"                PT Education - 12/21/15 1231    Education Details Explaination of functional anatomy and improtance of HEP   Person(s) Educated Patient   Methods Explanation;Demonstration;Verbal cues;Tactile cues   Comprehension Verbalized understanding;Returned demonstration  PT Short Term Goals - 12/21/15 1239      PT SHORT TERM GOAL #1   Title Patient will ambulate 200' w/ 1 crutch without significant increase in pain    Time 4   Period Weeks   Status Unable to assess     PT SHORT TERM GOAL #2   Title Patient will be I w/ initial HEP for knee ROM and strengthening    Time 4   Period Weeks   Status Partially Met     PT SHORT TERM GOAL #3   Title Patient will increase knee flexion to 50 degrees   Baseline AA ROM  35 degrees   Time 4   Status Not Met     PT SHORT TERM GOAL #4   Title Patient will report 3/10 pain at worst in right knee    Baseline Pain varies   Time 4   Period Weeks   Status On-going           PT Long Term Goals - 12/10/15 1642      PT LONG TERM GOAL #1   Title Patient will stand for 1 hour without increased knee pain in order to go back to work    Time 8    Period Weeks   Status New     PT LONG TERM GOAL #2   Title Patient will bend to pick item up without pain in 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 Pt reporting no pain at rest. Pt still appearing fearful of right knee flexion, but was albe to verbalize understanding of improtance of movement. Pt did improve her PROM in her R knee flexion to 48 degrees.  We will continue to progress R knee flexion as pt tolerates and begin   Rehab Potential Fair   Clinical Impairments Affecting Rehab Potential significant motion loss and guarding    PT Frequency 2x / week   PT Duration 8 weeks   PT Treatment/Interventions ADLs/Self Care Home Management;Cryotherapy;Electrical Stimulation;Iontophoresis '4mg'$ /ml Dexamethasone;Moist Heat;Therapeutic exercise;Therapeutic activities;Manual techniques;Functional mobility training;Stair training;Gait training;Ultrasound;Patient/family education   PT Next Visit Plan Attmept ROM and light exercises. If patient does not progress return patient o MD.    PT Home Exercise Plan No new exercises added   Consulted and Agree with Plan of Care Patient   Family Member Consulted cousin accompanying pt       Patient will benefit from skilled therapeutic intervention in order to improve the following deficits and impairments:  Abnormal gait, Decreased activity tolerance, Decreased range of motion, Difficulty walking, Obesity, Pain, Impaired flexibility, Decreased strength, Decreased mobility, Decreased  endurance  Visit Diagnosis: Pain in right knee  Stiffness of right knee, not elsewhere classified  Difficulty in walking, not elsewhere classified  Muscle weakness (generalized)  Problem List There are no active problems to display for this patient.   Oretha Caprice, MPT  12/21/2015, 12:44 PM  [facility] [address] [place], [place], [address] Phone: [phone]   Fax:  [phone]  Name: Katherine Fischer MRN: [id number] Date of Birth: 1990-12-09

## 2015-12-22 ENCOUNTER — Encounter: Payer: Medicaid Other | Admitting: Physical Therapy

## 2015-12-23 ENCOUNTER — Ambulatory Visit: Payer: Self-pay | Attending: Sports Medicine | Admitting: Physical Therapy

## 2015-12-23 ENCOUNTER — Encounter: Payer: Self-pay | Admitting: Physical Therapy

## 2015-12-23 DIAGNOSIS — M6281 Muscle weakness (generalized): Secondary | ICD-10-CM | POA: Insufficient documentation

## 2015-12-23 DIAGNOSIS — M25561 Pain in right knee: Secondary | ICD-10-CM | POA: Insufficient documentation

## 2015-12-23 DIAGNOSIS — M25661 Stiffness of right knee, not elsewhere classified: Secondary | ICD-10-CM | POA: Insufficient documentation

## 2015-12-23 DIAGNOSIS — R262 Difficulty in walking, not elsewhere classified: Secondary | ICD-10-CM | POA: Insufficient documentation

## 2015-12-23 NOTE — Patient Instructions (Signed)
Continue HEP 

## 2015-12-23 NOTE — Therapy (Signed)
[place], [place], [address] Phone: [phone]   Fax:  [phone]  Physical Therapy Treatment  Patient Details  Name: Katherine Fischer MRN: [id number] Date of Birth: 09/13/1990 Referring Provider: Legrand Como rigby DO  Encounter Date: 12/23/2015      PT End of Session - 12/23/15 1127    Visit Number 5   Number of Visits 16   Date for PT Re-Evaluation 02/04/16   Authorization Type CAFA expires 10/11    PT Start Time 1105   PT Stop Time 1145   PT Time Calculation (min) 40 min   Activity Tolerance Patient tolerated treatment well   Behavior During Therapy [facility] for tasks assessed/performed      History reviewed. No pertinent past medical history.  History reviewed. No pertinent surgical history.  There were no vitals filed for this visit.      Subjective Assessment - 12/23/15 1121    Subjective pt reporting 3/10 pain in R knee today. Pt reporting she is trying to bend her R knee more, but has difficutly tolerating the pain which can increase to 7-8/10.    Patient is accompained by: Family member   Pertinent History History of bilateral patelar instability    Limitations Standing;Lifting;Walking   How long can you sit comfortably? > 20 min    How long can you stand comfortably? > 12 minutes    How long can you walk comfortably? limited household distances    Diagnostic tests MRI: toen right medial PF retinaculaum; torn medial patellar ligament   Patient Stated Goals Get back to work; strengthen the leg so this does not happen again   Currently in Pain? Yes   Pain Score 3    Pain Location Knee   Pain Orientation Right   Pain Descriptors / Indicators Aching;Tightness   Pain Type Acute pain   Aggravating Factors  bending, sit to stand, stand to sit   Pain Relieving Factors Ice, ibuprophen, keeping the knee straight   Effect of Pain on Daily Activities bending, walking   Multiple Pain Sites No             [facility] PT Assessment - 12/23/15 0001      PROM   Overall PROM Comments 42 degrees with manual stabilizing the patella                     [facility] Adult PT Treatment/Exercise - 12/23/15 0001      Ambulation/Gait   Gait Comments reviewed gait with crutches focusing on step through gait pattern and heel to toe     Knee/Hip Exercises: Seated   Other Seated Knee/Hip Exercises knee flexion off EOB guided by PT, 14 lb on quads   Hamstring Limitations isometric into PT hand at end range flexion  5 reps     Knee/Hip Exercises: Supine   Quad Sets 15 reps  with knee on bolster, holding 1-2 seconds   Other Supine Knee/Hip Exercises Performed gentle soft tissue mobilization to R knee     Cryotherapy   Number Minutes Cryotherapy 10 Minutes   Cryotherapy Location Knee   Type of Cryotherapy Ice pack     Manual Therapy   Manual Therapy Taping   McConnell patellar inf glide with inverted "V"                PT Education - 12/23/15 1124    Education provided Yes   Education Details Importance of bending her R  knee for ROM and importance of HEP, Reviewed supine and sitting exercises and gait training with crutches   Person(s) Educated Patient   Methods Explanation;Demonstration;Verbal cues   Comprehension Verbal cues required;Returned demonstration;Verbalized understanding          PT Short Term Goals - 12/23/15 1308      PT SHORT TERM GOAL #1   Title Patient will ambulate 200' w/ 1 crutch without significant increase in pain    Time 4   Period Weeks   Status Not Met     PT SHORT TERM GOAL #2   Title Patient will be I w/ initial HEP for knee ROM and strengthening    Baseline pt able to demonstrate   Period Weeks   Status Achieved     PT SHORT TERM GOAL #3   Title Patient will increase knee flexion to 50 degrees   Baseline AA ROM  42 degrees   Time 4   Period Weeks   Status Not Met     PT SHORT TERM GOAL #4   Title Patient will report 3/10  pain at worst in right knee    Baseline Pain varies   Time 4   Period Weeks   Status On-going           PT Long Term Goals - 12/10/15 1642      PT LONG TERM GOAL #1   Title Patient will stand for 1 hour without increased knee pain in order to go back to work    Time 8   Period Weeks   Status New     PT LONG TERM GOAL #2   Title Patient will bend to pick item up without pain in 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   Time 8   Period Weeks     PT [place] #3   Title Patient will ambulate 1 mile without increased pain in order to return to work    Time 8   Period Weeks   Status New     PT LONG TERM GOAL #4   Title Patient will increase passive knee flexion toi 120 degrees in order to get up and down out of a low chair   Time 8   Period Weeks   Status New               Plan - 12/23/15 1129    Clinical Impression Statement Pt presenting to therapy with R knee pain with movement. Pt reporting no pain at rest and 3/10 pain upon arrival. Pt still expressing fear when moving the R knee however was able to verbalize the importance of exercise and ROM. Will continue to progress ROM, Strengthening, and gait training. Pt did report her pain is improving.    Rehab Potential Fair   Clinical Impairments Affecting Rehab Potential significant motion loss and guarding    PT Frequency 2x / week   PT Duration 8 weeks   PT Treatment/Interventions ADLs/Self Care Home Management;Cryotherapy;Electrical Stimulation;Iontophoresis 79m/ml Dexamethasone;Moist Heat;Therapeutic exercise;Therapeutic activities;Manual techniques;Functional mobility training;Stair training;Gait training;Ultrasound;Patient/family education   PT Next Visit Plan Attmept ROM and light exercises. If patient does not progress return patient o MD.    PT Home Exercise Plan No new exercises added   Consulted and Agree with Plan of Care Patient   Family Member Consulted cousin accompanying pt, pt's mother in lobby and is  driving pt to and from appointments      Patient will benefit from skilled therapeutic intervention  in order to improve the following deficits and impairments:  Abnormal gait, Decreased activity tolerance, Decreased range of motion, Difficulty walking, Obesity, Pain, Impaired flexibility, Decreased strength, Decreased mobility, Decreased endurance  Visit Diagnosis: Pain in right knee  Stiffness of right knee, not elsewhere classified  Difficulty in walking, not elsewhere classified  Muscle weakness (generalized)     Problem List There are no active problems to display for this patient.   Oretha Caprice, MPT  12/23/2015, 1:12 PM  [facility] [address] [place], [place], [address] Phone: [phone]   Fax:  [phone]  Name: Katherine Fischer MRN: [id number] Date of Birth: [birth year]

## 2015-12-24 ENCOUNTER — Encounter: Payer: Medicaid Other | Admitting: Physical Therapy

## 2015-12-28 ENCOUNTER — Ambulatory Visit: Payer: Self-pay | Admitting: Physical Therapy

## 2015-12-28 ENCOUNTER — Encounter: Payer: Self-pay | Admitting: Physical Therapy

## 2015-12-28 DIAGNOSIS — M25561 Pain in right knee: Secondary | ICD-10-CM

## 2015-12-28 DIAGNOSIS — M6281 Muscle weakness (generalized): Secondary | ICD-10-CM

## 2015-12-28 DIAGNOSIS — M25661 Stiffness of right knee, not elsewhere classified: Secondary | ICD-10-CM

## 2015-12-28 DIAGNOSIS — R262 Difficulty in walking, not elsewhere classified: Secondary | ICD-10-CM

## 2015-12-28 NOTE — Therapy (Signed)
to 50 degrees   Baseline AA ROM  42 degrees   Time 4   Period Weeks   Status Not Met     PT SHORT TERM GOAL #4   Title Patient will report 3/10 pain at worst in right knee    Baseline Pain varies   Time 4   Period Weeks   Status On-going           PT Long Term Goals - 12/10/15 1642      PT LONG TERM GOAL #1   Title Patient will stand for 1 hour without increased knee pain in order to go back to work    Time 8   Period Weeks   Status New     PT LONG TERM GOAL #2   Title Patient will bend to pick item up without pain in 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   Time 8   Period Weeks     PT [place] #3   Title Patient will ambulate 1 mile without increased pain in order to return to work    Time 8   Period Weeks   Status New     PT LONG TERM GOAL #4   Title Patient will increase passive knee flexion toi 120 degrees in order to  get up and down out of a low chair   Time 8   Period Weeks   Status New               Plan - 12/28/15 1430    Clinical Impression Statement Flexion to 44 deg prior to treatment. Able to reach 56 deg on nustep at the end of treatment. Was instructed in gait pattern and importance of bending knee.    PT Next Visit Plan ROM   PT Home Exercise Plan ambulate with knee flexion during toe off   Consulted and Agree with Plan of Care Patient      Patient will benefit from skilled therapeutic intervention in order to improve the following deficits and impairments:     Visit Diagnosis: Pain in right knee  Stiffness of right knee, not elsewhere classified  Difficulty in walking, not elsewhere classified  Muscle weakness (generalized)     Problem List There are no active problems to display for this patient.  Lynann Demetrius C. Kristell Wooding PT, DPT 12/28/15 3:49 PM   [place] [facility] [address] [place], [place], [address] Phone: [phone]   Fax:  [phone]  Name: Katherine Fischer MRN: [id number] Date of Birth: [birth year]  [facility] [address] [place], [place], [address] Phone: [phone]   Fax:  [phone]  Physical Therapy Treatment  Patient Details  Name: Katherine Fischer MRN: [id number] Date of Birth: Mar 15, 1991 Referring Provider: Casimiro Needle rigby DO  Encounter Date: 12/28/2015      PT End of Session - 12/28/15 1419    Visit Number 6   Number of Visits 16   Date for PT Re-Evaluation 02/04/16   Authorization Type CAFA expires 10/11    PT Start Time 1419   PT Stop Time 1522   PT Time Calculation (min) 63 min   Activity Tolerance Patient limited by pain   Behavior During Therapy [facility] for tasks assessed/performed      History reviewed. No pertinent past medical history.  History reviewed. No pertinent surgical history.  There were no vitals filed for this visit.      Subjective Assessment - 12/28/15 1419    Subjective Pt ambulates most of the time without AD (keeps knee straight). Improving ability to move around in bed and sit in a chair. Reports tightness in knee, leg feels heavy when walking.    Currently in Pain? Yes   Pain Score 5    Pain Location Knee   Pain Orientation Right   Pain Descriptors / Indicators Tightness   Aggravating Factors  bending                         [facility] Adult PT Treatment/Exercise - 12/28/15 0001      Ambulation/Gait   Gait Comments without AD, knee flexion at toe off     Exercises   Exercises Knee/Hip     Knee/Hip Exercises: Aerobic   Nustep 5 min L6, flex to tolerance     Knee/Hip Exercises: Seated   Other Seated Knee/Hip Exercises heel slides with green strap   Other Seated Knee/Hip Exercises further flexion by DF on rocker board     Knee/Hip Exercises: Supine   Other Supine Knee/Hip Exercises heel slides down wall     Modalities   Modalities Electrical Stimulation     Cryotherapy   Number Minutes Cryotherapy 15 Minutes  concurrent with ESTIM   Cryotherapy Location  Knee  R   Type of Cryotherapy Ice pack     Electrical Stimulation   Electrical Stimulation Location L knee   Electrical Stimulation Action IFC   Electrical Stimulation Parameters IFC 15 min   Electrical Stimulation Goals Pain                PT Education - 12/28/15 1429    Education provided Yes   Education Details exercise form/rationale, gait, importance of knee flexion   Person(s) Educated Patient   Methods Explanation;Demonstration;Tactile cues;Verbal cues   Comprehension Verbalized understanding;Returned demonstration;Verbal cues required;Tactile cues required;Need further instruction          PT Short Term Goals - 12/23/15 1308      PT SHORT TERM GOAL #1   Title Patient will ambulate 200' w/ 1 crutch without significant increase in pain    Time 4   Period Weeks   Status Not Met     PT SHORT TERM GOAL #2   Title Patient will be I w/ initial HEP for knee ROM and strengthening    Baseline pt able to demonstrate   Period Weeks   Status Achieved     PT SHORT TERM GOAL #3   Title Patient will increase knee flexion

## 2015-12-29 ENCOUNTER — Encounter: Payer: Medicaid Other | Admitting: Physical Therapy

## 2015-12-30 ENCOUNTER — Ambulatory Visit: Payer: Self-pay | Admitting: Physical Therapy

## 2015-12-30 ENCOUNTER — Encounter: Payer: Self-pay | Admitting: Physical Therapy

## 2015-12-30 DIAGNOSIS — M6281 Muscle weakness (generalized): Secondary | ICD-10-CM

## 2015-12-30 DIAGNOSIS — M25661 Stiffness of right knee, not elsewhere classified: Secondary | ICD-10-CM

## 2015-12-30 DIAGNOSIS — R262 Difficulty in walking, not elsewhere classified: Secondary | ICD-10-CM

## 2015-12-30 DIAGNOSIS — M25561 Pain in right knee: Secondary | ICD-10-CM

## 2015-12-30 NOTE — Therapy (Signed)
LONG TERM GOAL #1   Title Patient will stand for 1 hour without increased knee pain in order to go back to work    Time 8   Period Weeks   Status New     PT LONG TERM GOAL #2   Title Patient will bend to pick item up without pain in 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   Time 8   Period Weeks     PT [place] #3   Title Patient will ambulate 1 mile without increased pain in order to return to work    Time 8   Period Weeks   Status New     PT LONG TERM GOAL #4   Title Patient will increase passive knee flexion toi 120 degrees in order to get up and down out of a low chair   Time 8   Period Weeks   Status New               Plan - 12/30/15 1502    Clinical Impression Statement Flexion to 64 at end of treatment, 60 prior to treatment. Pt appears to be developing bakers cyst in post aspect of knee due to excessive strain of extension. Verbal and tactile cues for knee flexion and swing through in gait. Pt asked if she can return to driving but was noted being assisted into seated in back seat with knee extended.    PT Next Visit Plan sitting in car?  gait pattern.    Consulted and Agree with Plan of Care Patient      Patient will benefit from skilled  therapeutic intervention in order to improve the following deficits and impairments:     Visit Diagnosis: Pain in right knee  Stiffness of right knee, not elsewhere classified  Difficulty in walking, not elsewhere classified  Muscle weakness (generalized)     Problem List There are no active problems to display for this patient.   Odie Edmonds C. Samaj Wessells PT, DPT 12/30/15 3:05 PM   [facility] [address] [place], [place], [address] Phone: [phone]   Fax:  [phone]  Name: KATRENIA ALKINS MRN: [id number] Date of Birth: 1990-08-29  [place], [place], [address] Phone: [phone]   Fax:  [phone]  Physical Therapy Treatment  Patient Details  Name: JALIYAH FOTHERINGHAM MRN: [id number] Date of Birth: September 05, 1990 Referring Provider: Legrand Como rigby DO  Encounter Date: 12/30/2015      PT End of Session - 12/30/15 1416    Visit Number 7   Number of Visits 16   Date for PT Re-Evaluation 02/04/16   Authorization Type CAFA expires 10/11    PT Start Time 1330   PT Stop Time 1415   PT Time Calculation (min) 45 min   Activity Tolerance Patient limited by pain   Behavior During Therapy [facility] for tasks assessed/performed      History reviewed. No pertinent past medical history.  History reviewed. No pertinent surgical history.  There were no vitals filed for this visit.      Subjective Assessment - 12/30/15 1335    Subjective Pt reports pain is mosly just under knee cap. Was not able to sleep last night due to pain at posterior knee.   Currently in Pain? Yes   Pain Score 7    Pain Location Knee   Pain Orientation Right   Pain Descriptors / Indicators --  feels like it is going to pop            [facility] PT Assessment - 12/30/15 0001      PROM   Overall PROM Comments 60 prior to treatment                     [facility] Adult PT Treatment/Exercise - 12/30/15 0001      Knee/Hip Exercises: Stretches   Hip Flexor Stretch Limitations prone with green strap     Knee/Hip Exercises: Aerobic   Nustep 10 min focus on flexion ROM     Knee/Hip Exercises: Standing   Gait Training swing through     Knee/Hip Exercises: Supine   Straight Leg Raises 20 reps;10 reps     Knee/Hip Exercises: Prone   Hamstring Curl 20 reps;10 reps     Manual Therapy   Manual Therapy Taping   McConnell patellar inferior glide   Kinesiotex Facilitate Muscle     Kinesiotix   Facilitate Muscle  hamstrings                  PT Short Term  Goals - 12/23/15 1308      PT SHORT TERM GOAL #1   Title Patient will ambulate 200' w/ 1 crutch without significant increase in pain    Time 4   Period Weeks   Status Not Met     PT SHORT TERM GOAL #2   Title Patient will be I w/ initial HEP for knee ROM and strengthening    Baseline pt able to demonstrate   Period Weeks   Status Achieved     PT SHORT TERM GOAL #3   Title Patient will increase knee flexion to 50 degrees   Baseline AA ROM  42 degrees   Time 4   Period Weeks   Status Not Met     PT SHORT TERM GOAL #4   Title Patient will report 3/10 pain at worst in right knee    Baseline Pain varies   Time 4   Period Weeks   Status On-going           PT Long Term Goals - 12/10/15 1642      PT

## 2015-12-31 ENCOUNTER — Encounter: Payer: Medicaid Other | Admitting: Physical Therapy

## 2016-01-04 ENCOUNTER — Ambulatory Visit: Payer: Self-pay | Admitting: Physical Therapy

## 2016-01-04 ENCOUNTER — Encounter: Payer: Medicaid Other | Admitting: Physical Therapy

## 2016-01-04 DIAGNOSIS — M6281 Muscle weakness (generalized): Secondary | ICD-10-CM

## 2016-01-04 DIAGNOSIS — M25561 Pain in right knee: Secondary | ICD-10-CM

## 2016-01-04 DIAGNOSIS — R262 Difficulty in walking, not elsewhere classified: Secondary | ICD-10-CM

## 2016-01-04 DIAGNOSIS — M25661 Stiffness of right knee, not elsewhere classified: Secondary | ICD-10-CM

## 2016-01-04 NOTE — Therapy (Signed)
[facility] [address] [place], [place], [address] Phone: [phone]   Fax:  [phone]  Physical Therapy Treatment  Patient Details  Name: Katherine Fischer MRN: [id number] Date of Birth: 1990/08/06 Referring Provider: Casimiro Needle rigby DO  Encounter Date: 01/04/2016      PT End of Session - 01/04/16 1457    Visit Number 8   Number of Visits 16   Date for PT Re-Evaluation 02/04/16   PT Start Time 1416   PT Stop Time 1505   PT Time Calculation (min) 49 min   Equipment Utilized During Treatment Other (comment)  soft knee brace   Activity Tolerance Patient tolerated treatment well   Behavior During Therapy [facility] for tasks assessed/performed      No past medical history on file.  No past surgical history on file.  There were no vitals filed for this visit.      Subjective Assessment - 01/04/16 1422    Subjective I am walking better i think.  Pain with exercises.  5/10   Currently in Pain? Yes   Pain Score 5    Pain Location Knee   Pain Orientation Right   Pain Descriptors / Indicators Sore   Pain Frequency Intermittent   Aggravating Factors  bending, one place too long   Pain Relieving Factors ice, wraps, compression knee straight, ibuprophen, muscle relaxers            [facility] PT Assessment - 01/04/16 0001      PROM   Overall PROM Comments 60 prior to PT  67 sitting at edge of mat, hanging pain6/10                     [facility] Adult PT Treatment/Exercise - 01/04/16 0001      Ambulation/Gait   Gait Comments gait with cane,  multiple instructions,  trunk rotation cues,  stride length, on level.    able to relax arm,  move legs easier.  2/10 pain with this.     Therapeutic Activites    Therapeutic Activities --  simulate getting in /out of car,  cues     Knee/Hip Exercises: Stretches   Lobbyist --  sitting on edge of mat small roller under thigh to elevate,    Quad Stretch Limitations 6/10  painful.anterior knee     Knee/Hip Exercises: Aerobic   Nustep 6 minutes focused on flexion.      Cryotherapy   Number Minutes Cryotherapy 10 Minutes   Cryotherapy Location Knee   Type of Cryotherapy --  cold pack,  knee flexed                PT Education - 01/04/16 1457    Education provided Yes   Education Details gait training   Person(s) Educated Patient   Methods Explanation;Demonstration;Tactile cues;Verbal cues   Comprehension Verbalized understanding;Returned demonstration;Need further instruction          PT Short Term Goals - 01/04/16 1501      PT SHORT TERM GOAL #1   Title Patient will ambulate 200' w/ 1 crutch without significant increase in pain    Time 4   Period Weeks   Status Unable to assess     PT SHORT TERM GOAL #2   Title Patient will be I w/ initial HEP for knee ROM and strengthening    Time 4   Period Weeks   Status Achieved     PT SHORT TERM GOAL #3  Title Patient will increase knee flexion to 50 degrees   Baseline hangs to 67, AROM not assessed   Time 4   Period Weeks   Status On-going     PT SHORT TERM GOAL #4   Title Patient will report 3/10 pain at worst in right knee    Baseline 7/10 worst today during session   Time 4   Period Weeks   Status On-going           PT Long Term Goals - 12/10/15 1642      PT LONG TERM GOAL #1   Title Patient will stand for 1 hour without increased knee pain in order to go back to work    Time 8   Period Weeks   Status New     PT LONG TERM GOAL #2   Title Patient will bend to pick item up without pain in 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   Time 8   Period Weeks     PT LONG TERM GOAL #3   Title Patient will ambulate 1 mile without increased pain in order to return to work    Time 8   Period Weeks   Status New     PT LONG TERM GOAL #4   Title Patient will increase passive knee flexion toi 120 degrees in order to get up and down out of a low chair   Time 8   Period Weeks    Status New               Plan - 01/04/16 1458    Clinical Impression Statement Pain 67 at end of tratment hanging off edge of mat.  2/10 pain post session priot to cold pack.     PT Next Visit Plan Car transfer practice in her car, she wants to sit in the front seat.   PT Home Exercise Plan continue.  Consider using one of her Grandmother's canes.    Consulted and Agree with Plan of Care Patient      Patient will benefit from skilled therapeutic intervention in order to improve the following deficits and impairments:  Abnormal gait, Decreased activity tolerance, Decreased range of motion, Difficulty walking, Obesity, Pain, Impaired flexibility, Decreased strength, Decreased mobility, Decreased endurance  Visit Diagnosis: Pain in right knee  Stiffness of right knee, not elsewhere classified  Difficulty in walking, not elsewhere classified  Muscle weakness (generalized)     Problem List There are no active problems to display for this patient.   [facility] 01/04/2016, 3:04 PM  [facility] [address] [place], [place], [address] Phone: [phone]   Fax:  [phone]  Name: Katherine Fischer MRN: [id number] Date of Birth: 1990-10-05   Liz Beach, PTA 01/04/16 3:04 PM Phone: [phone] Fax: [phone]

## 2016-01-05 ENCOUNTER — Encounter: Payer: Medicaid Other | Admitting: Physical Therapy

## 2016-01-06 ENCOUNTER — Encounter: Payer: Self-pay | Admitting: Physical Therapy

## 2016-01-06 ENCOUNTER — Encounter: Payer: Medicaid Other | Admitting: Physical Therapy

## 2016-01-06 ENCOUNTER — Ambulatory Visit: Payer: Self-pay | Admitting: Physical Therapy

## 2016-01-06 DIAGNOSIS — R262 Difficulty in walking, not elsewhere classified: Secondary | ICD-10-CM

## 2016-01-06 DIAGNOSIS — M6281 Muscle weakness (generalized): Secondary | ICD-10-CM

## 2016-01-06 DIAGNOSIS — M25561 Pain in right knee: Secondary | ICD-10-CM

## 2016-01-06 DIAGNOSIS — M25661 Stiffness of right knee, not elsewhere classified: Secondary | ICD-10-CM

## 2016-01-06 NOTE — Therapy (Signed)
[facility] [address] [place], [place], [address] Phone: [phone]   Fax:  [phone]  Physical Therapy Treatment  Patient Details  Name: Katherine Fischer MRN: [id number] Date of Birth: [birth year] Referring Provider: Casimiro Needle rigby DO  Encounter Date: 01/06/2016      PT End of Session - 01/06/16 1421    Visit Number 9   Number of Visits 16   Date for PT Re-Evaluation 02/04/16   PT Start Time 1416   PT Stop Time 1455   PT Time Calculation (min) 39 min   Activity Tolerance Patient tolerated treatment well   Behavior During Therapy [facility] for tasks assessed/performed      History reviewed. No pertinent past medical history.  History reviewed. No pertinent surgical history.  There were no vitals filed for this visit.      Subjective Assessment - 01/06/16 1419    Subjective Is going to try driving today, has a goal of at least 80 deg flx today. Continues to feel sore and stiff. Does not use crutches around home, one crutch otherwise. Has been riding in front seat of car since last visit and reports it feels good.    Currently in Pain? Yes   Pain Score 5    Pain Location Knee   Pain Orientation Right   Pain Descriptors / Indicators Sore;Aching  stiffness                         [facility] Adult PT Treatment/Exercise - 01/06/16 0001      Knee/Hip Exercises: Stretches   Passive Hamstring Stretch Limitations x30s standing in parallel bars     Knee/Hip Exercises: Aerobic   Nustep 10 min L1, knee flexion stretch     Knee/Hip Exercises: Standing   Knee Flexion 20 reps;10 reps;Both   Knee Flexion Limitations alternating HS curls in parallel bars   Forward Step Up Limitations step into airex with R   Step Down Hand Hold: 2;Step Height: 2";20 reps   Step Down Limitations R leg on step, lower from PF in L   Gait Training fwd/retro in parallel bars, on treadmil. PT manual assist in both for toe off, knee flexion  in swing through and weight shift.    Other Standing Knee Exercises sand L on airex, R steps fwd and behind                PT Education - 01/06/16 1421    Education provided Yes   Education Details exercise form/rationale, HEP, gait pattern   Person(s) Educated Patient   Methods Explanation;Tactile cues;Demonstration;Verbal cues   Comprehension Verbalized understanding;Returned demonstration;Verbal cues required;Tactile cues required;Need further instruction          PT Short Term Goals - 01/04/16 1501      PT SHORT TERM GOAL #1   Title Patient will ambulate 200' w/ 1 crutch without significant increase in pain    Time 4   Period Weeks   Status Unable to assess     PT SHORT TERM GOAL #2   Title Patient will be I w/ initial HEP for knee ROM and strengthening    Time 4   Period Weeks   Status Achieved     PT SHORT TERM GOAL #3   Title Patient will increase knee flexion to 50 degrees   Baseline hangs to 67, AROM not assessed   Time 4   Period Weeks   Status On-going  PT SHORT TERM GOAL #4   Title Patient will report 3/10 pain at worst in right knee    Baseline 7/10 worst today during session   Time 4   Period Weeks   Status On-going           PT Long Term Goals - 12/10/15 1642      PT LONG TERM GOAL #1   Title Patient will stand for 1 hour without increased knee pain in order to go back to work    Time 8   Period Weeks   Status New     PT LONG TERM GOAL #2   Title Patient will bend to pick item up without pain in 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   Time 8   Period Weeks     PT LONG TERM GOAL #3   Title Patient will ambulate 1 mile without increased pain in order to return to work    Time 8   Period Weeks   Status New     PT LONG TERM GOAL #4   Title Patient will increase passive knee flexion toi 120 degrees in order to get up and down out of a low chair   Time 8   Period Weeks   Status New               Plan - 01/06/16 1507     Clinical Impression Statement Pt to 77 on nustep today. Increased challenge for weight bearing and gait pattern today. 2 incidence noted when pt had to stop what she was doing due to anterior knee pain.    PT Next Visit Plan gait training, increased weight bearing exercises.    Consulted and Agree with Plan of Care Patient;Family member/caregiver   Family Member Consulted grandfather      Patient will benefit from skilled therapeutic intervention in order to improve the following deficits and impairments:     Visit Diagnosis: Pain in right knee  Stiffness of right knee, not elsewhere classified  Difficulty in walking, not elsewhere classified  Muscle weakness (generalized)     Problem List There are no active problems to display for this patient.   Takia Runyon C. Chizara Mena PT, DPT 01/06/16 3:10 PM   [facility] [address] [place], [place], [address] Phone: [phone]   Fax:  [phone]  Name: JARIANNA HULTS MRN: [id number] Date of Birth: 02-16-1991

## 2016-01-07 ENCOUNTER — Encounter: Payer: Medicaid Other | Admitting: Physical Therapy

## 2016-01-11 ENCOUNTER — Encounter: Payer: Medicaid Other | Admitting: Physical Therapy

## 2016-01-12 ENCOUNTER — Encounter: Payer: Medicaid Other | Admitting: Physical Therapy

## 2016-01-13 ENCOUNTER — Encounter: Payer: Medicaid Other | Admitting: Physical Therapy

## 2016-01-13 ENCOUNTER — Ambulatory Visit: Payer: Self-pay | Admitting: Physical Therapy

## 2016-01-13 ENCOUNTER — Encounter: Payer: Self-pay | Admitting: Physical Therapy

## 2016-01-13 DIAGNOSIS — M25661 Stiffness of right knee, not elsewhere classified: Secondary | ICD-10-CM

## 2016-01-13 DIAGNOSIS — R262 Difficulty in walking, not elsewhere classified: Secondary | ICD-10-CM

## 2016-01-13 DIAGNOSIS — M25561 Pain in right knee: Secondary | ICD-10-CM

## 2016-01-13 DIAGNOSIS — M6281 Muscle weakness (generalized): Secondary | ICD-10-CM

## 2016-01-13 NOTE — Therapy (Signed)
[facility] [address] [place], [place], [address] Phone: [phone]   Fax:  [phone]  Physical Therapy Treatment  Fischer Details  Name: Katherine Fischer MRN: [id number] Date of Birth: May 18, 1991 Referring Provider: Casimiro Needle rigby DO  Encounter Date: 01/13/2016      PT End of Session - 01/13/16 1421    Visit Number 10   Number of Visits 16   Date for PT Re-Evaluation 02/04/16   Authorization Type CAFA expires 10/11    PT Start Time 1415   PT Stop Time 1508   PT Time Calculation (min) 53 min   Activity Tolerance Fischer tolerated treatment well   Behavior During Therapy [facility] for tasks assessed/performed      History reviewed. No pertinent past medical history.  History reviewed. No pertinent surgical history.  There were no vitals filed for this visit.      Subjective Assessment - 01/13/16 1416    Subjective Tried to do stairs when at grandfathers house yesterday. Feels like she has to look at her RLE in order to make it move the way she wants. continues to have lateral knee swelling.    How long can you sit comfortably? 1 hour+   How long can you stand comfortably? 2 hours   How long can you walk comfortably? 1 hour   Diagnostic tests MRI: toen right medial PF retinaculaum; torn medial patellar ligament   Fischer Stated Goals Get back to work; strengthen the leg so this does not happen again   Currently in Pain? Yes   Pain Score 4    Pain Location Knee   Pain Orientation Right   Pain Descriptors / Indicators Sore   Aggravating Factors  being too active    Pain Relieving Factors ice, rest            [facility] PT Assessment - 01/13/16 0001      ROM / Strength   AROM / PROM / Strength AROM;Strength;PROM     AROM   AROM Assessment Site Knee   Right/Left Knee Right   Right Knee Extension 0   Right Knee Flexion 87     PROM   PROM Assessment Site Knee   Right/Left Knee Right   Right Knee Extension 0   Right Knee Flexion 93     Strength   Strength Assessment Site Knee   Right/Left Knee Right   Right Knee Flexion 4-/5   Right Knee Extension 2/5                     [facility] Adult PT Treatment/Exercise - 01/13/16 0001      Knee/Hip Exercises: Aerobic   Nustep 10' L3, UE assist, 5 min no UE     Knee/Hip Exercises: Seated   Other Seated Knee/Hip Exercises short arc quads & quad sets     Cryotherapy   Number Minutes Cryotherapy 10 Minutes   Cryotherapy Location Knee   Type of Cryotherapy Ice pack     Electrical Stimulation   Electrical Stimulation Location L quads   Electrical Stimulation Action Russian   Electrical Stimulation Parameters 15 min output 55  fully attended with cuing and education   Statistician Goals Neuromuscular facilitation                PT Education - 01/13/16 1421    Education provided Yes   Education Details exercise form/rationale, HEP   Person(s) Educated Fischer   Methods Explanation;Demonstration;Tactile cues;Verbal cues  Comprehension Verbalized understanding;Returned demonstration;Verbal cues required;Tactile cues required;Need further instruction          PT Short Term Goals - 01/13/16 1423      PT SHORT TERM GOAL #1   Title Fischer will ambulate 200' w/ 1 crutch without significant increase in pain    Status Achieved     PT SHORT TERM GOAL #2   Title Fischer will be I w/ initial HEP for knee ROM and strengthening    Status Achieved     PT SHORT TERM GOAL #3   Title Fischer will increase knee flexion to 50 degrees   Status Achieved     PT SHORT TERM GOAL #4   Title Fischer will report 3/10 pain at worst in right knee    Baseline 7/10- every day when she wakes up   Status On-going           PT Long Term Goals - 12/10/15 1642      PT LONG TERM GOAL #1   Title Fischer will stand for 1 hour without increased knee pain in order to go back to work    Time 8   Period Weeks   Status New      PT LONG TERM GOAL #2   Title Fischer will bend to pick item up without pain in 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   Time 8   Period Weeks     PT LONG TERM GOAL #3   Title Fischer will ambulate 1 mile without increased pain in order to return to work    Time 8   Period Weeks   Status New     PT LONG TERM GOAL #4   Title Fischer will increase passive knee flexion toi 120 degrees in order to get up and down out of a low chair   Time 8   Period Weeks   Status New               Plan - 01/13/16 1452    Clinical Impression Statement [place] performed today in long sitting for quad set. Will progress to russian with SAQ next visit. Pt continues to demo improvements in ROM and gait pattern and will continue to benefit from strengthening to provide support to knee joint and return to work/age-appropriate activites.    PT Treatment/Interventions ADLs/Self Care Home Management;Cryotherapy;Electrical Stimulation;Iontophoresis 4mg /ml Dexamethasone;Moist Heat;Therapeutic exercise;Therapeutic activities;Manual techniques;Functional mobility training;Stair training;Gait training;Ultrasound;Fischer/family education   PT Next Visit Plan quad activation, russian, CKC activites   PT Home Exercise Plan quad sets   Consulted and Agree with Plan of Care Fischer      Fischer will benefit from skilled therapeutic intervention in order to improve the following deficits and impairments:  Abnormal gait, Decreased activity tolerance, Decreased range of motion, Difficulty walking, Obesity, Pain, Impaired flexibility, Decreased strength, Decreased mobility, Decreased endurance  Visit Diagnosis: Pain in right knee  Stiffness of right knee, not elsewhere classified  Difficulty in walking, not elsewhere classified  Muscle weakness (generalized)     Problem List There are no active problems to display for this Fischer.  Katherine Fischer PT, DPT 01/13/16 3:42 PM   [facility] Outpatient  Rehabilitation [facility] [address] [place], [place], [address] Phone: [phone]   Fax:  [phone]  Name: Katherine Fischer MRN: [id number] Date of Birth: 01/11/1991

## 2016-01-14 ENCOUNTER — Encounter: Payer: Medicaid Other | Admitting: Physical Therapy

## 2016-01-14 ENCOUNTER — Ambulatory Visit: Payer: Self-pay | Admitting: Physical Therapy

## 2016-01-14 DIAGNOSIS — M25661 Stiffness of right knee, not elsewhere classified: Secondary | ICD-10-CM

## 2016-01-14 DIAGNOSIS — R262 Difficulty in walking, not elsewhere classified: Secondary | ICD-10-CM

## 2016-01-14 DIAGNOSIS — M25561 Pain in right knee: Secondary | ICD-10-CM

## 2016-01-14 DIAGNOSIS — M6281 Muscle weakness (generalized): Secondary | ICD-10-CM

## 2016-01-14 NOTE — Therapy (Signed)
[facility]patient Rehabilitation [facility] [address] [place], [place], [address] Phone: [phone]   Fax:  [phone]  Physical Therapy Treatment  Patient Details  Name: Katherine Fischer MRN: [id number] Date of Birth: January 29, 1991 Referring Provider: Casimiro Needle rigby DO  Encounter Date: 01/14/2016      PT End of Session - 01/14/16 1458    Visit Number 11   Number of Visits 16   Date for PT Re-Evaluation 02/04/16   Authorization Type CAFA expires 10/11    PT Start Time 1415   PT Stop Time 1512   PT Time Calculation (min) 57 min   Activity Tolerance Patient tolerated treatment well   Behavior During Therapy [facility] for tasks assessed/performed      No past medical history on file.  No past surgical history on file.  There were no vitals filed for this visit.      Subjective Assessment - 01/14/16 1419    Subjective Practiced quad sets at home and reports improvements.    Currently in Pain? Yes   Pain Score 4    Pain Location Knee   Pain Orientation Right   Pain Descriptors / Indicators Tightness                         [facility] Adult PT Treatment/Exercise - 01/14/16 0001      Knee/Hip Exercises: Stretches   Knee: Self-Stretch Limitations 3' with green strap     Knee/Hip Exercises: Seated   Long Arc Quad Limitations 5 min of attempts   Other Seated Knee/Hip Exercises short arc quads 3' of attempts     Knee/Hip Exercises: Supine   Straight Leg Raises Limitations 5' of attempts    Other Supine Knee/Hip Exercises reformer presses 1 red 1 blue     Cryotherapy   Number Minutes Cryotherapy 15 Minutes   Cryotherapy Location Knee  R   Type of Cryotherapy Ice pack                PT Education - 01/13/16 1421    Education provided Yes   Education Details exercise form/rationale, HEP   Person(s) Educated Patient   Methods Explanation;Demonstration;Tactile cues;Verbal cues   Comprehension Verbalized understanding;Returned  demonstration;Verbal cues required;Tactile cues required;Need further instruction          PT Short Term Goals - 01/13/16 1423      PT SHORT TERM GOAL #1   Title Patient will ambulate 200' w/ 1 crutch without significant increase in pain    Status Achieved     PT SHORT TERM GOAL #2   Title Patient will be I w/ initial HEP for knee ROM and strengthening    Status Achieved     PT SHORT TERM GOAL #3   Title Patient will increase knee flexion to 50 degrees   Status Achieved     PT SHORT TERM GOAL #4   Title Patient will report 3/10 pain at worst in right knee    Baseline 7/10- every day when she wakes up   Status On-going           PT Long Term Goals - 12/10/15 1642      PT LONG TERM GOAL #1   Title Patient will stand for 1 hour without increased knee pain in order to go back to work    Time 8   Period Weeks   Status New     PT LONG TERM GOAL #2   Title Patient  will bend to pick item up without pain in 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   Time 8   Period Weeks     PT LONG TERM GOAL #3   Title Patient will ambulate 1 mile without increased pain in order to return to work    Time 8   Period Weeks   Status New     PT LONG TERM GOAL #4   Title Patient will increase passive knee flexion toi 120 degrees in order to get up and down out of a low chair   Time 8   Period Weeks   Status New               Plan - 01/14/16 1432    Clinical Impression Statement Pt was able to demo SAQ and LAQ today after multiple attempts and notable difficulty, unable to perform active straight leg raise. These available actions indicate improved quad contraction and control. Improved knee flexion in R swing phase.   PT Next Visit Plan quad strength, ROM   PT Home Exercise Plan SAQ, knee flexion stretching   Consulted and Agree with Plan of Care Patient      Patient will benefit from skilled therapeutic intervention in order to improve the following deficits and impairments:      Visit Diagnosis: Pain in right knee  Stiffness of right knee, not elsewhere classified  Difficulty in walking, not elsewhere classified  Muscle weakness (generalized)     Problem List There are no active problems to display for this patient.   Kishawn Pickar C. Taggart Prasad PT, DPT 01/14/16 3:02 PM   [facility] Outpatient Rehabilitation [facility] [address] [place], [place], [address] Phone: [phone]   Fax:  [phone]  Name: JALENE LAWRIE MRN: [id number] Date of Birth: Apr 18, 1991

## 2016-01-19 ENCOUNTER — Ambulatory Visit: Payer: Self-pay | Admitting: Physical Therapy

## 2016-01-20 ENCOUNTER — Ambulatory Visit: Payer: Self-pay | Admitting: Physical Therapy

## 2016-01-20 ENCOUNTER — Encounter: Payer: Self-pay | Admitting: Physical Therapy

## 2016-01-20 DIAGNOSIS — M25561 Pain in right knee: Secondary | ICD-10-CM

## 2016-01-20 DIAGNOSIS — M25661 Stiffness of right knee, not elsewhere classified: Secondary | ICD-10-CM

## 2016-01-20 DIAGNOSIS — R262 Difficulty in walking, not elsewhere classified: Secondary | ICD-10-CM

## 2016-01-20 DIAGNOSIS — M6281 Muscle weakness (generalized): Secondary | ICD-10-CM

## 2016-01-20 NOTE — Therapy (Signed)
[facility] Outpatient Rehabilitation [facility] [address] [place], [place], [address] Phone: [phone]   Fax:  [phone]  Physical Therapy Treatment  Patient Details  Name: Katherine Fischer Fischer: [id number] Date of Birth: 06-14-1990 Referring Provider: Casimiro Needle rigby DO  Encounter Date: 01/20/2016      PT End of Session - 01/20/16 0929    Visit Number 12   Number of Visits 16   Date for PT Re-Evaluation 02/04/16   Authorization Type CAFA expires 10/11    PT Start Time 0930   PT Stop Time 1026   PT Time Calculation (min) 56 min   Activity Tolerance Patient tolerated treatment well   Behavior During Therapy [facility] for tasks assessed/performed      History reviewed. No pertinent past medical history.  History reviewed. No pertinent surgical history.  There were no vitals filed for this visit.      Subjective Assessment - 01/20/16 0931    Subjective Pt reports she has not used a crutch at all for about 2 weeks. Has been released to work by MD wiht limited ability. Pt reports MD wants her to try DN for quads.    Currently in Pain? Yes   Pain Score 5    Pain Location Knee   Pain Orientation Right;Lateral   Pain Descriptors / Indicators Tightness            [facility] PT Assessment - 01/20/16 0001      PROM   Right Knee Flexion 99                     [facility] Adult PT Treatment/Exercise - 01/20/16 0001      Knee/Hip Exercises: Stretches   Lobbyist Limitations prone quad stretch with strap 3x30s     Knee/Hip Exercises: Aerobic   Stationary Bike 10' within avail ROM     Knee/Hip Exercises: Seated   Other Seated Knee/Hip Exercises stool scoots 187ft   Sit to Sand 15 reps     Knee/Hip Exercises: Sidelying   Hip ABduction 20 reps;10 reps   Hip ABduction Limitations knee flexed due to pain in ext     Knee/Hip Exercises: Prone   Hamstring Curl 20 reps;10 reps   Hamstring Curl Limitations hamstring curl + hip ext     Moist Heat  Therapy   Number Minutes Moist Heat 10 Minutes   Moist Heat Location Other (comment)  R quads     Manual Therapy   Manual therapy comments edu on roller to quads          Trigger Point Dry Needling - 01/20/16 1012    Consent Given? Yes   Education Handout Provided No   Muscles Treated Lower Body Quadriceps   Quadriceps Response Twitch response elicited              PT Education - 01/20/16 0929    Education provided Yes   Education Details exercise form/rationale   Person(s) Educated Patient   Methods Explanation;Demonstration;Tactile cues;Verbal cues   Comprehension Verbalized understanding;Returned demonstration;Verbal cues required;Tactile cues required;Need further instruction          PT Short Term Goals - 01/13/16 1423      PT SHORT TERM GOAL #1   Title Patient will ambulate 200' w/ 1 crutch without significant increase in pain    Status Achieved     PT SHORT TERM GOAL #2   Title Patient will be I w/ initial HEP for knee ROM and strengthening  Status Achieved     PT SHORT TERM GOAL #3   Title Patient will increase knee flexion to 50 degrees   Status Achieved     PT SHORT TERM GOAL #4   Title Patient will report 3/10 pain at worst in right knee    Baseline 7/10- every day when she wakes up   Status On-going           PT Long Term Goals - 12/10/15 1642      PT LONG TERM GOAL #1   Title Patient will stand for 1 hour without increased knee pain in order to go back to work    Time 8   Period Weeks   Status New     PT LONG TERM GOAL #2   Title Patient will bend to pick item up without pain in 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   Time 8   Period Weeks     PT LONG TERM GOAL #3   Title Patient will ambulate 1 mile without increased pain in order to return to work    Time 8   Period Weeks   Status New     PT LONG TERM GOAL #4   Title Patient will increase passive knee flexion toi 120 degrees in order to get up and down out of a low chair    Time 8   Period Weeks   Status New               Plan - 01/20/16 0940    Clinical Impression Statement Dry needling performed today by KL, twitch response elicited in rectus and intermedius. Pt demo improved activation of quads but cont to demo genu varus due to weakness in hamstrings, hip abductors and gluts. Pt is interviewing for return to work.    PT Treatment/Interventions ADLs/Self Care Home Management;Cryotherapy;Electrical Stimulation;Iontophoresis 4mg /ml Dexamethasone;Moist Heat;Therapeutic exercise;Therapeutic activities;Manual techniques;Functional mobility training;Stair training;Gait training;Ultrasound;Patient/family education;Passive range of motion;Neuromuscular re-education;Balance training;Dry needling;Taping;Vasopneumatic Device   PT Next Visit Plan schedule for DN after interview, hamstring/glut strength, hip abd strength   Consulted and Agree with Plan of Care Patient      Patient will benefit from skilled therapeutic intervention in order to improve the following deficits and impairments:  Abnormal gait, Decreased activity tolerance, Decreased range of motion, Difficulty walking, Obesity, Pain, Impaired flexibility, Decreased strength, Decreased mobility, Decreased endurance  Visit Diagnosis: Pain in right knee - Plan: PT plan of care cert/re-cert  Stiffness of right knee, not elsewhere classified - Plan: PT plan of care cert/re-cert  Difficulty in walking, not elsewhere classified - Plan: PT plan of care cert/re-cert  Muscle weakness (generalized) - Plan: PT plan of care cert/re-cert     Problem List There are no active problems to display for this patient.   Amay Mijangos C. Neila Teem PT, DPT 01/20/16 12:11 PM   [facility] Outpatient Rehabilitation [facility] [address] [place], [place], [address] Phone: [phone]   Fax:  [phone]  Name: Katherine Fischer: [id number] Date of Birth: 28-Dec-1990

## 2016-01-21 ENCOUNTER — Ambulatory Visit: Payer: Self-pay | Admitting: Physical Therapy

## 2016-01-21 ENCOUNTER — Encounter: Payer: Self-pay | Admitting: Physical Therapy

## 2016-01-21 DIAGNOSIS — M25561 Pain in right knee: Secondary | ICD-10-CM

## 2016-01-21 DIAGNOSIS — M25661 Stiffness of right knee, not elsewhere classified: Secondary | ICD-10-CM

## 2016-01-21 DIAGNOSIS — M6281 Muscle weakness (generalized): Secondary | ICD-10-CM

## 2016-01-21 DIAGNOSIS — R262 Difficulty in walking, not elsewhere classified: Secondary | ICD-10-CM

## 2016-01-21 NOTE — Therapy (Signed)
report 3/10 pain at worst in right knee    Baseline 7/10- every day when she wakes up   Status On-going           PT Long Term Goals - 12/10/15 1642      PT LONG TERM GOAL #1   Title Patient will stand for 1 hour without increased knee pain in order to go back to work    Time 8   Period Weeks   Status New     PT LONG TERM GOAL #2   Title Patient will bend to pick item up without pain in 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   Time 8   Period Weeks     PT LONG TERM GOAL #3   Title Patient will ambulate 1 mile without increased pain in order to return to work    Time 8   Period Weeks   Status New     PT LONG TERM GOAL #4   Title Patient will increase passive knee flexion toi 120 degrees in order to get up and down out of a low chair   Time 8   Period Weeks   Status New               Plan - 01/21/16 1505    Clinical Impression  Statement Continued to challenge hamstring and glut strength today and will move to dry needling when able to change her schedule based off of work.    PT Next Visit Plan schedule for DN after interview, hamstring/glut strength, hip abd strength      Patient will benefit from skilled therapeutic intervention in order to improve the following deficits and impairments:     Visit Diagnosis: Pain in right knee  Stiffness of right knee, not elsewhere classified  Difficulty in walking, not elsewhere classified  Muscle weakness (generalized)     Problem List There are no active problems to display for this patient.   Katherine Fischer Stiff PT, DPT 01/21/16 3:07 PM   [facility] [address] [place], [place], [address] Phone: [phone]   Fax:  [phone]  Name: Katherine Fischer MRN: [id number] Date of Birth: 02-23-1991  [facility] [address] [place], [place], [address] Phone: [phone]   Fax:  [phone]  Physical Therapy Treatment  Patient Details  Name: Katherine Fischer MRN: [id number] Date of Birth: 1990-11-24 Referring Provider: Casimiro NeedleMichael rigby DO  Encounter Date: 01/21/2016      PT End of Session - 01/21/16 1329    Visit Number 13   Number of Visits 16   Date for PT Re-Evaluation 02/04/16   Authorization Type CAFA expires 10/11    PT Start Time 1330   PT Stop Time 1425   PT Time Calculation (min) 55 min   Activity Tolerance Patient tolerated treatment well   Behavior During Therapy [facility] for tasks assessed/performed      History reviewed. No pertinent past medical history.  History reviewed. No pertinent surgical history.  There were no vitals filed for this visit.      Subjective Assessment - 01/21/16 1340    Subjective Pt reports mild decrease in pain following needling yesterday.    Currently in Pain? Yes   Pain Location Knee   Pain Orientation Right  superior to patella   Pain Descriptors / Indicators Sore                         [facility] Adult PT Treatment/Exercise - 01/21/16 0001      Knee/Hip Exercises: Aerobic   Nustep 10' L4     Knee/Hip Exercises: Standing   Hip Extension 20 reps   Extension Limitations knee flexed holding ball, semiprone on elbows   Other Standing Knee Exercises hip ext with knee ext x20, semiprone on elbows     Knee/Hip Exercises: Seated   Other Seated Knee/Hip Exercises stool scoots 17500ft     Knee/Hip Exercises: Supine   Other Supine Knee/Hip Exercises reformer, see note     Cryotherapy   Number Minutes Cryotherapy 10 Minutes   Cryotherapy Location Knee   Type of Cryotherapy Ice pack     Manual Therapy   Manual therapy comments roller to quads, leg off bed and PT pressing into flx ROM     Reformer: 2 red, 1 blue double leg press 1 red single leg press (R) 1 red  heel raises with knees flexed 2 red 1 blue static bridges      Trigger Point Dry Needling - 01/20/16 1012    Consent Given? Yes   Education Handout Provided No   Muscles Treated Lower Body Quadriceps   Quadriceps Response Twitch response elicited              PT Education - 01/21/16 1343    Education provided Yes   Education Details exercise form/rationale   Person(s) Educated Patient   Methods Explanation;Demonstration;Tactile cues;Verbal cues   Comprehension Verbalized understanding;Verbal cues required;Returned demonstration;Tactile cues required;Need further instruction          PT Short Term Goals - 01/13/16 1423      PT SHORT TERM GOAL #1   Title Patient will ambulate 200' w/ 1 crutch without significant increase in pain    Status Achieved     PT SHORT TERM GOAL #2   Title Patient will be I w/ initial HEP for knee ROM and strengthening    Status Achieved     PT SHORT TERM GOAL #3   Title Patient will increase knee flexion to 50 degrees   Status Achieved     PT SHORT TERM GOAL #4   Title Patient will

## 2016-01-26 ENCOUNTER — Ambulatory Visit: Payer: Self-pay | Admitting: Physical Therapy

## 2016-01-27 ENCOUNTER — Ambulatory Visit: Payer: Self-pay | Attending: Sports Medicine | Admitting: Physical Therapy

## 2016-01-27 ENCOUNTER — Encounter: Payer: Self-pay | Admitting: Physical Therapy

## 2016-01-27 DIAGNOSIS — R262 Difficulty in walking, not elsewhere classified: Secondary | ICD-10-CM | POA: Insufficient documentation

## 2016-01-27 DIAGNOSIS — M25661 Stiffness of right knee, not elsewhere classified: Secondary | ICD-10-CM | POA: Insufficient documentation

## 2016-01-27 DIAGNOSIS — M25561 Pain in right knee: Secondary | ICD-10-CM | POA: Insufficient documentation

## 2016-01-27 DIAGNOSIS — M6281 Muscle weakness (generalized): Secondary | ICD-10-CM | POA: Insufficient documentation

## 2016-01-27 NOTE — Therapy (Signed)
[facility] Outpatient Rehabilitation [facility] [address] [place], [place], [address] Phone: [phone]   Fax:  [phone]  Physical Therapy Treatment  Patient Details  Name: Katherine Fischer MRN: [id number] Date of Birth: June 18, 1990 Referring Provider: Casimiro Needle rigby DO  Encounter Date: 01/27/2016      PT End of Session - 01/27/16 1230    Visit Number 14   Number of Visits 16   Date for PT Re-Evaluation 02/04/16   Authorization Type CAFA expires 10/11    PT Start Time 1145   PT Stop Time 1229   PT Time Calculation (min) 44 min   Activity Tolerance Patient tolerated treatment well   Behavior During Therapy [facility] for tasks assessed/performed      History reviewed. No pertinent past medical history.  History reviewed. No pertinent surgical history.  There were no vitals filed for this visit.      Subjective Assessment - 01/27/16 1148    Subjective Pt walked out of her house today without her knee brace and felt okay. Leg feels exhausted when laying on side with knee bent while sleeping. Still unable to maneuver steps correctly.   Patient Stated Goals Get back to work; strengthen the leg so this does not happen again   Currently in Pain? Yes   Pain Score 2    Aggravating Factors  sleeping, flexion ROM                         [facility] Adult PT Treatment/Exercise - 01/27/16 0001      Knee/Hip Exercises: Aerobic   Stationary Bike 7 min within avail range     Knee/Hip Exercises: Standing   Forward Step Up Limitations x15 step up/down 6"   Functional Squat Limitations x15 in parallel bars on wobble board   Other Standing Knee Exercises A/P weight shift on wobble board 2'   Other Standing Knee Exercises retro gait with knees flexed      Knee/Hip Exercises: Supine   Other Supine Knee/Hip Exercises reformer: R single leg press, iso bridges, Qped R LE press                  PT Short Term Goals - 01/13/16 1423      PT SHORT TERM  GOAL #1   Title Patient will ambulate 200' w/ 1 crutch without significant increase in pain    Status Achieved     PT SHORT TERM GOAL #2   Title Patient will be I w/ initial HEP for knee ROM and strengthening    Status Achieved     PT SHORT TERM GOAL #3   Title Patient will increase knee flexion to 50 degrees   Status Achieved     PT SHORT TERM GOAL #4   Title Patient will report 3/10 pain at worst in right knee    Baseline 7/10- every day when she wakes up   Status On-going           PT Long Term Goals - 12/10/15 1642      PT LONG TERM GOAL #1   Title Patient will stand for 1 hour without increased knee pain in order to go back to work    Time 8   Period Weeks   Status New     PT LONG TERM GOAL #2   Title Patient will bend to pick item up without pain in 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   Time 8  Period Weeks     PT LONG TERM GOAL #3   Title Patient will ambulate 1 mile without increased pain in order to return to work    Time 8   Period Weeks   Status New     PT LONG TERM GOAL #4   Title Patient will increase passive knee flexion toi 120 degrees in order to get up and down out of a low chair   Time 8   Period Weeks   Status New               Plan - 01/27/16 1220    Clinical Impression Statement Pt reported significant fatigue in lower extremity following treatment today. Cont to demo poor eccentric control in both flexion and extension resulting in excessive extension strain on HS and gastroc/soleus tendons.    PT Next Visit Plan DN, quad control   Consulted and Agree with Plan of Care Patient      Patient will benefit from skilled therapeutic intervention in order to improve the following deficits and impairments:     Visit Diagnosis: Pain in right knee  Stiffness of right knee, not elsewhere classified  Difficulty in walking, not elsewhere classified  Muscle weakness (generalized)     Problem List There are no active problems to  display for this patient.   Savannah Erbe C. Alysiah Suppa PT, DPT 01/27/16 1:00 PM   [facility] [address] [place], [place], [address] Phone: [phone]   Fax:  [phone]  Name: Katherine Fischer MRN: [id number] Date of Birth: [birth year]

## 2016-02-02 ENCOUNTER — Ambulatory Visit: Payer: Self-pay | Admitting: Physical Therapy

## 2016-02-03 ENCOUNTER — Ambulatory Visit: Payer: Self-pay | Admitting: Physical Therapy

## 2016-02-03 DIAGNOSIS — M25661 Stiffness of right knee, not elsewhere classified: Secondary | ICD-10-CM

## 2016-02-03 DIAGNOSIS — M6281 Muscle weakness (generalized): Secondary | ICD-10-CM

## 2016-02-03 DIAGNOSIS — R262 Difficulty in walking, not elsewhere classified: Secondary | ICD-10-CM

## 2016-02-03 DIAGNOSIS — M25561 Pain in right knee: Secondary | ICD-10-CM

## 2016-02-03 NOTE — Therapy (Signed)
[facility] [place] [facility] Outpatient Rehabilitation [facility] [address] [place], [place], [address] Phone: [phone]   Fax:  [phone]  Physical Therapy Treatment  Patient Details  Name: Katherine Fischer MRN: [id number] Date of Birth: Jan 20, 1991 Referring Provider: Casimiro Needle rigby DO  Encounter Date: 02/03/2016      PT End of Session - 02/03/16 1235    Visit Number 15   Number of Visits 16   Date for PT Re-Evaluation 02/04/16   Authorization Type CAFA expires 10/11    PT Start Time 1148   PT Stop Time 1241   PT Time Calculation (min) 53 min      No past medical history on file.  No past surgical history on file.  There were no vitals filed for this visit.      Subjective Assessment - 02/03/16 1234    Subjective I have an interview after this   Currently in Pain? --  did not rate, doing well, just not confident in knee   Aggravating Factors  inclines, bending   Pain Relieving Factors ice, rest                         [facility] Adult PT Treatment/Exercise - 02/03/16 0001      Knee/Hip Exercises: Aerobic   Nustep 10' L4     Knee/Hip Exercises: Standing   Forward Step Up 10 reps;Hand Hold: 2;Hand Hold: 1;Step Height: 4"   Forward Step Up Limitations max verbal and tactile cues required- max encouragement provided   Functional Squat 20 reps   Gait Training resisted gait 2-3 plates with forward and retro ambulation     Knee/Hip Exercises: Seated   Long Arc Quad --  x30   Long Arc Quad Weight 5 lbs.   Sit to Sand 15 reps;without UE support  10 reps with eleveated seat      Cryotherapy   Number Minutes Cryotherapy 10 Minutes   Cryotherapy Location Knee   Type of Cryotherapy Ice pack                  PT Short Term Goals - 01/13/16 1423      PT SHORT TERM GOAL #1   Title Patient will ambulate 200' w/ 1 crutch without significant increase in pain    Status Achieved     PT SHORT TERM GOAL #2   Title Patient will be I w/ initial  HEP for knee ROM and strengthening    Status Achieved     PT SHORT TERM GOAL #3   Title Patient will increase knee flexion to 50 degrees   Status Achieved     PT SHORT TERM GOAL #4   Title Patient will report 3/10 pain at worst in right knee    Baseline 7/10- every day when she wakes up   Status On-going           PT Long Term Goals - 12/10/15 1642      PT LONG TERM GOAL #1   Title Patient will stand for 1 hour without increased knee pain in order to go back to work    Time 8   Period Weeks   Status New     PT LONG TERM GOAL #2   Title Patient will bend to pick item up without pain in 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   Time 8   Period Weeks     PT LONG TERM GOAL #3   Title Patient  will ambulate 1 mile without increased pain in order to return to work    Time 8   Period Weeks   Status New     PT LONG TERM GOAL #4   Title Patient will increase passive knee flexion toi 120 degrees in order to get up and down out of a low chair   Time 8   Period Weeks   Status New               Plan - 02/03/16 1237    Clinical Impression Statement Pt requires max encouragement and cues to perform 4 inch step up. She has difficulty with inclines. We worked on sit-stands from elevated seat as well and weight shifting during squates to improve confidence with Weigth bearing to RLE. Pt with questions about strengthing exercises. No increased pain.   PT Next Visit Plan DN, quad control;ERO      Patient will benefit from skilled therapeutic intervention in order to improve the following deficits and impairments:  Abnormal gait, Decreased activity tolerance, Decreased range of motion, Difficulty walking, Obesity, Pain, Impaired flexibility, Decreased strength, Decreased mobility, Decreased endurance  Visit Diagnosis: Pain in right knee  Stiffness of right knee, not elsewhere classified  Difficulty in walking, not elsewhere classified  Muscle weakness  (generalized)     Problem List There are no active problems to display for this patient.   Sherrie Mustache, [place] 02/03/2016, 12:40 PM  [facility] [address] [place], [place], [address] Phone: [phone]   Fax:  [phone]  Name: Katherine Fischer MRN: [id number] Date of Birth: [birth year]

## 2016-02-04 ENCOUNTER — Ambulatory Visit: Payer: Self-pay | Admitting: Physical Therapy

## 2016-02-04 ENCOUNTER — Encounter: Payer: Medicaid Other | Admitting: Physical Therapy

## 2016-02-04 DIAGNOSIS — M25661 Stiffness of right knee, not elsewhere classified: Secondary | ICD-10-CM

## 2016-02-04 DIAGNOSIS — M25561 Pain in right knee: Secondary | ICD-10-CM

## 2016-02-04 DIAGNOSIS — M6281 Muscle weakness (generalized): Secondary | ICD-10-CM

## 2016-02-04 DIAGNOSIS — R262 Difficulty in walking, not elsewhere classified: Secondary | ICD-10-CM

## 2016-02-04 NOTE — Therapy (Signed)
[place], [place], [address] Phone: [phone]   Fax:  [phone]  Physical Therapy Treatment / Re-certification  Patient Details  Name: Katherine Fischer MRN: [id number] Date of Birth: 1991/01/28 Referring Provider: Legrand Como rigby DO  Encounter Date: 02/04/2016      PT End of Session - 02/04/16 1056    Visit Number 16   Number of Visits 24   Date for PT Re-Evaluation 03/17/16   Authorization Type CAFA expires 10/11    PT Start Time 1030  pt arrived 15 minutes late   PT Stop Time 1108   PT Time Calculation (min) 38 min   Activity Tolerance Patient tolerated treatment well   Behavior During Therapy [facility] for tasks assessed/performed      No past medical history on file.  No past surgical history on file.  There were no vitals filed for this visit.      Subjective Assessment - 02/04/16 1035    Subjective "still feeling very tight and swollen, but I can function, still getting good improvement since starting"    Currently in Pain? Yes   Pain Score 3    Pain Location Knee   Pain Orientation Right   Pain Descriptors / Indicators Sore   Pain Type Chronic pain   Pain Onset More than a month ago   Pain Frequency Intermittent   Aggravating Factors  walking, bending   Pain Relieving Factors ice/ resting   Effect of Pain on Daily Activities bending, walking            [facility] PT Assessment - 02/04/16 0001      Observation/Other Assessments   Focus on Therapeutic Outcomes (FOTO)  31% limitation     AROM   Right Knee Extension 0   Right Knee Flexion 113     PROM   Right Knee Flexion 120     Strength   Right Knee Flexion 4/5   Right Knee Extension 4-/5                     [facility] Adult PT Treatment/Exercise - 02/04/16 0001      Knee/Hip Exercises: Seated   Sit to Sand 2 sets;10 reps  with tactile cues on R hip      Knee/Hip Exercises: Supine   Straight Leg Raises  AROM;Strengthening;2 sets;10 reps     Moist Heat Therapy   Number Minutes Moist Heat 10 Minutes   Moist Heat Location Other (comment)  quad with pt in supine          Trigger Point Dry Needling - 02/04/16 1305    Consent Given? Yes   Education Handout Provided Yes  given previously   Muscles Treated Lower Body Quadriceps   Quadriceps Response Twitch response elicited;Palpable increased muscle length  rectus femoris x 6 with pistoning technique              PT Education - 02/04/16 1308    Education provided Yes   Education Details working on confidence performing exercises, mechanics of sit to stand, step-ups won't cause the knee cap to dislocate and discussed the primary mechanics that will cause the knee cap to dislocate.    Person(s) Educated Patient   Methods Explanation;Demonstration;Verbal cues   Comprehension Verbalized understanding;Returned demonstration;Verbal cues required          PT Short Term Goals - 02/04/16 1052      PT SHORT TERM GOAL #1   Title Patient  will ambulate 200' w/ 1 crutch without significant increase in pain    Time 4   Period Weeks   Status Achieved     PT SHORT TERM GOAL #2   Title Patient will be I w/ initial HEP for knee ROM and strengthening    Baseline pt able to demonstrate   Time 4   Period Weeks   Status Achieved     PT SHORT TERM GOAL #3   Title Patient will increase knee flexion to 50 degrees   Time 4   Period Weeks   Status Achieved     PT SHORT TERM GOAL #4   Title Patient will report 3/10 pain at worst in right knee    Baseline 4/10 pain    Time 4   Period Weeks   Status Partially Met           PT Long Term Goals - 02/04/16 1054      PT LONG TERM GOAL #1   Title Patient will stand for 1 hour without increased knee pain in order to go back to work    Baseline atleast 1.5 hours   Time 8   Period Weeks   Status Achieved     PT LONG TERM GOAL #2   Title Patient will bend to pick item up without pain  in 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   Baseline mild pain secondary to guarding from apprehension   Time 8   Period Weeks   Status Partially [place] #3   Title Patient will ambulate 1 mile without increased pain in order to return to work    Baseline mild pain secondary to guarding from apprehension   Time 8   Period Weeks   Status Partially Met     PT LONG TERM GOAL #4   Title Patient will increase passive knee flexion toi 120 degrees in order to get up and down out of a low chair   Baseline met 120 PROM goal but is unable to get out of low chair   Time 8   Period Weeks   Status Partially Met               Plan - 02/04/16 1315    Clinical Impression Statement Katherine Fischer is progressing with knee AROM and strength with extension and flexion but reports pain and guarding/ apprehension with extension testing. she continues to demo apprehenis with step-ups and sit to standing exercise due to fear of dislocation. pt is progressing well with goals. TPDN performed along R rectus femoris with palpable twitching and lengthening. pt was able to peform sit to stand but requires verbal / tactile cues to promote equal weight bearing through both LE which she was able to correct. plan to continue with currentl POC to work on remaining goals, confidence with funcitonal exericse and independent exercise.    PT Frequency 2x / week   PT Duration 4 weeks   PT Next Visit Plan assess response to TPDN, quad control, functional step-ups, squating/sit to stand, working on confidence,    Consulted and Agree with Plan of Care Patient      Patient will benefit from skilled therapeutic intervention in order to improve the following deficits and impairments:  Abnormal gait, Decreased activity tolerance, Decreased range of motion, Difficulty walking, Obesity, Pain, Impaired flexibility, Decreased strength, Decreased mobility, Decreased endurance  Visit Diagnosis: Pain in right  knee  Stiffness of right knee, not  elsewhere classified  Difficulty in walking, not elsewhere classified  Muscle weakness (generalized)     Problem List There are no active problems to display for this patient.  Starr Lake PT, DPT, LAT, ATC  02/04/16  1:22 PM      [facility] [address] [place], [place], [address] Phone: [phone]   Fax:  [phone]  Name: Katherine Fischer MRN: [id number] Date of Birth: 1991/03/13

## 2016-02-08 ENCOUNTER — Ambulatory Visit: Payer: Self-pay | Admitting: Physical Therapy

## 2016-02-08 DIAGNOSIS — M6281 Muscle weakness (generalized): Secondary | ICD-10-CM

## 2016-02-08 DIAGNOSIS — M25661 Stiffness of right knee, not elsewhere classified: Secondary | ICD-10-CM

## 2016-02-08 DIAGNOSIS — M25561 Pain in right knee: Secondary | ICD-10-CM

## 2016-02-08 DIAGNOSIS — R262 Difficulty in walking, not elsewhere classified: Secondary | ICD-10-CM

## 2016-02-08 NOTE — Therapy (Signed)
[place], [place], [address] Phone: [phone]   Fax:  [phone]  Physical Therapy Treatment  Patient Details  Name: Katherine Fischer MRN: [id number] Date of Birth: [birth year] Referring Provider: Legrand Como rigby DO  Encounter Date: 02/08/2016      PT End of Session - 02/08/16 1155    Visit Number 17   Number of Visits 24   Date for PT Re-Evaluation 03/17/16   Authorization Type CAFA expires 10/11    PT Start Time 2423   PT Stop Time 1102   PT Time Calculation (min) 47 min   Activity Tolerance Patient tolerated treatment well   Behavior During Therapy [facility] for tasks assessed/performed      No past medical history on file.  No past surgical history on file.  There were no vitals filed for this visit.      Subjective Assessment - 02/08/16 1019    Subjective "no pain today, I wasn't in pain the whole day yesterday and I didn't ice. pt reports working to where she doesn't have to use her brace"    Currently in Pain? Yes   Pain Score 0-No pain   Pain Location Knee   Pain Orientation Right   Pain Type Chronic pain                         [facility] Adult PT Treatment/Exercise - 02/08/16 0001      Knee/Hip Exercises: Aerobic   Nustep L6 x 5 min     Knee/Hip Exercises: Machines for Strengthening   Total Gym Leg Press 2 x 10 pushing with both, controlled eccentric loading with RLE only     Knee/Hip Exercises: Standing   Wall Squat 2 sets;10 reps   Wall Squat Limitations using door frame as tactile feedback against R shoulder/ hip to keep from L trunk leaning  verbal cues for form to keep back against the door.      Knee/Hip Exercises: Supine   Straight Leg Raises AROM;Strengthening;2 sets;10 reps     Manual Therapy   Manual Therapy Soft tissue mobilization   Soft tissue mobilization IASTM over proximal/ mid rectus femoris on R           Trigger Point Dry Needling - 02/08/16  1036    Consent Given? Yes   Education Handout Provided Yes  given previously   Quadriceps Response Twitch response elicited;Palpable increased muscle length  x5 with pistoning/ twisting technique              PT Education - 02/08/16 1155    Education provided Yes   Education Details wall squats using the corner of the room using the wall to touch the R shoulder/ hip and keep contact during squat activity to avoid compensation   Person(s) Educated Patient   Methods Explanation;Verbal cues   Comprehension Verbalized understanding;Verbal cues required          PT Short Term Goals - 02/04/16 1052      PT SHORT TERM GOAL #1   Title Patient will ambulate 200' w/ 1 crutch without significant increase in pain    Time 4   Period Weeks   Status Achieved     PT SHORT TERM GOAL #2   Title Patient will be I w/ initial HEP for knee ROM and strengthening    Baseline pt able to demonstrate   Time 4   Period Weeks   Status Achieved  PT SHORT TERM GOAL #3   Title Patient will increase knee flexion to 50 degrees   Time 4   Period Weeks   Status Achieved     PT SHORT TERM GOAL #4   Title Patient will report 3/10 pain at worst in right knee    Baseline 4/10 pain    Time 4   Period Weeks   Status Partially Met           PT Long Term Goals - 02/04/16 1054      PT LONG TERM GOAL #1   Title Patient will stand for 1 hour without increased knee pain in order to go back to work    Baseline atleast 1.5 hours   Time 8   Period Weeks   Status Achieved     PT LONG TERM GOAL #2   Title Patient will bend to pick item up without pain in 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   Baseline mild pain secondary to guarding from apprehension   Time 8   Period Weeks   Status Partially Met     PT LONG TERM GOAL #3   Title Patient will ambulate 1 mile without increased pain in order to return to work    Baseline mild pain secondary to guarding from apprehension   Time 8   Period  Weeks   Status Partially Met     PT LONG TERM GOAL #4   Title Patient will increase passive knee flexion toi 120 degrees in order to get up and down out of a low chair   Baseline met 120 PROM goal but is unable to get out of low chair   Time 8   Period Weeks   Status Partially Met               Plan - 02/08/16 1156    Clinical Impression Statement Mrs. nowland reports no pain today. TPDN performed on R rectus femoris followed with IASTM. pt is able to perform exercises but continues to demo L hip leaning compensation. Utilized tactile cues to correct which she is able to do without pain. pt declined modalities post session.    PT Next Visit Plan assess response to TPDN, quad control, functional step-ups, squating/sit to stand, working on confidence, gait training for equal step time   Consulted and Agree with Plan of Care Patient      Patient will benefit from skilled therapeutic intervention in order to improve the following deficits and impairments:  Abnormal gait, Decreased activity tolerance, Decreased range of motion, Difficulty walking, Obesity, Pain, Impaired flexibility, Decreased strength, Decreased mobility, Decreased endurance  Visit Diagnosis: Pain in right knee  Stiffness of right knee, not elsewhere classified  Difficulty in walking, not elsewhere classified  Muscle weakness (generalized)     Problem List There are no active problems to display for this patient.  Starr Lake PT, DPT, LAT, ATC  02/08/16  12:04 PM      [facility] [facility] [address] [place], [place], [address] Phone: [phone]   Fax:  [phone]  Name: Katherine Fischer MRN: [id number] Date of Birth: 04-30-1991

## 2016-02-09 ENCOUNTER — Ambulatory Visit: Payer: Self-pay | Admitting: Physical Therapy

## 2016-02-09 ENCOUNTER — Encounter: Payer: Medicaid Other | Admitting: Physical Therapy

## 2016-02-11 ENCOUNTER — Ambulatory Visit: Payer: Self-pay | Admitting: Physical Therapy

## 2016-02-12 ENCOUNTER — Encounter: Payer: Medicaid Other | Admitting: Physical Therapy

## 2016-02-15 ENCOUNTER — Ambulatory Visit: Payer: Medicaid Other | Admitting: Physical Therapy

## 2016-02-15 DIAGNOSIS — R262 Difficulty in walking, not elsewhere classified: Secondary | ICD-10-CM

## 2016-02-15 DIAGNOSIS — M25661 Stiffness of right knee, not elsewhere classified: Secondary | ICD-10-CM

## 2016-02-15 DIAGNOSIS — M6281 Muscle weakness (generalized): Secondary | ICD-10-CM

## 2016-02-15 DIAGNOSIS — M25561 Pain in right knee: Secondary | ICD-10-CM

## 2016-02-15 NOTE — Therapy (Addendum)
[place], [place], [address] Phone: [phone]   Fax:  [phone]  Physical Therapy Treatment / Discharge Note  Patient Details  Name: Katherine Fischer MRN: [id number] Date of Birth: [birth year] Referring Provider: Legrand Como rigby DO  Encounter Date: 02/15/2016      PT End of Session - 02/15/16 0809    Visit Number 18   Number of Visits 24   Date for PT Re-Evaluation 03/17/16   PT Start Time 0732   PT Stop Time 0800   PT Time Calculation (min) 28 min   Activity Tolerance Patient tolerated treatment well   Behavior During Therapy [facility] for tasks assessed/performed      No past medical history on file.  No past surgical history on file.  There were no vitals filed for this visit.      Subjective Assessment - 02/15/16 0734    Subjective 1/10 pain. Needle helped. Uses brace at work.  I trust it more.  I catch myself using leg more normal, like sitting down with my knee flexed.  It hurts a little.     Currently in Pain? Yes   Pain Score 1    Pain Location Knee   Pain Orientation Right   Pain Descriptors / Indicators Sore   Pain Frequency Intermittent   Aggravating Factors  steps,    Pain Relieving Factors brace, ice rest                         [facility] Adult PT Treatment/Exercise - 02/15/16 0001      Knee/Hip Exercises: Aerobic   Stationary Bike 5 minutes     Knee/Hip Exercises: Standing   Terminal Knee Extension Limitations 20 +   Forward Step Up Limitations 2 inches cues,  each.  emotional componet noted   Wall Squat 10 reps   Wall Squat Limitations cues equal weight shift     Knee/Hip Exercises: Seated   Sit to Sand 2 sets;10 reps  cues equal weight shifyting                  PT Short Term Goals - 02/15/16 0818      PT SHORT TERM GOAL #1   Title Patient will ambulate 200' w/ 1 crutch without significant increase in pain    Time 4   Period Weeks   Status  Achieved     PT SHORT TERM GOAL #2   Title Patient will be I w/ initial HEP for knee ROM and strengthening    Baseline pt able to demonstrate   Time 4   Period Weeks   Status Achieved     PT SHORT TERM GOAL #3   Title Patient will increase knee flexion to 50 degrees   Time 4   Period Weeks   Status Achieved     PT SHORT TERM GOAL #4   Title Patient will report 3/10 pain at worst in right knee    Time 4   Period Weeks   Status Unable to assess           PT Long Term Goals - 02/15/16 6269      PT LONG TERM GOAL #1   Title Patient will stand for 1 hour without increased knee pain in order to go back to work    Time 8   Period Weeks   Status Achieved     PT LONG TERM GOAL #2  Title Patient will bend to pick item up without pain in 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   Baseline mild   Time 8   Period Weeks   Status Partially Met     PT [place] #3   Title Patient will ambulate 1 mile without increased pain in order to return to work    Time 8   Period Weeks   Status Unable to assess     PT LONG TERM GOAL #4   Title Patient will increase passive knee flexion toi 120 degrees in order to get up and down out of a low chair   Time 8   Period Weeks   Status Unable to assess               Plan - 02/15/16 0810    Clinical Impression Statement terminal knee difficult.  Some limtations limited emotionally/ fearful      Patient will benefit from skilled therapeutic intervention in order to improve the following deficits and impairments:     Visit Diagnosis: Pain in right knee  Stiffness of right knee, not elsewhere classified  Difficulty in walking, not elsewhere classified  Muscle weakness (generalized)     Problem List There are no active problems to display for this patient.   Hamzeh Tall PTA 02/15/2016, 8:23 AM  [facility] [address] [place], [place], [address] Phone: [phone]    Fax:  [phone]  Name: Katherine Fischer MRN: [id number] Date of Birth: [birth year]  PHYSICAL THERAPY DISCHARGE SUMMARY  Visits from Start of Care: 18  Current functional level related to goals / functional outcomes: See goals   Remaining deficits: unknown   Education / Equipment: HEP, theraband, posture, anatomy,   Plan: Patient agrees to discharge.  Patient goals were partially met. Patient is being discharged due to not returning since the last visit.  ?????     Kristoffer Leamon PT, DPT, LAT, ATC  04/21/16  12:16 PM

## 2016-02-17 ENCOUNTER — Ambulatory Visit: Payer: Self-pay | Admitting: Physical Therapy

## 2016-04-06 ENCOUNTER — Ambulatory Visit (INDEPENDENT_AMBULATORY_CARE_PROVIDER_SITE_OTHER): Payer: Self-pay | Admitting: Sports Medicine

## 2016-05-02 ENCOUNTER — Encounter (INDEPENDENT_AMBULATORY_CARE_PROVIDER_SITE_OTHER): Payer: Self-pay | Admitting: Sports Medicine

## 2016-05-02 ENCOUNTER — Ambulatory Visit (INDEPENDENT_AMBULATORY_CARE_PROVIDER_SITE_OTHER): Payer: Self-pay | Admitting: Sports Medicine

## 2016-05-02 VITALS — BP 117/74 | HR 91 | Ht 66.0 in | Wt 215.0 lb

## 2016-05-02 DIAGNOSIS — S83004D Unspecified dislocation of right patella, subsequent encounter: Secondary | ICD-10-CM

## 2016-05-02 HISTORY — DX: Unspecified dislocation of right patella, subsequent encounter: S83.004D

## 2016-05-02 NOTE — Progress Notes (Signed)
   Katherine Fischer - 25 y.o. female MRN [id number]  Date of birth: 1991-03-30  Office Visit Note: Visit Date: 05/02/2016 PCP: Truman Haywardakia S Starkes, FNP Referred by: Truman HaywardStarkes, Takia S, FNP  Subjective: Chief Complaint  Patient presents with  . Right Knee - Follow-up  . Follow-up    Patient states knee doesn't ache, more of the cyst that is aching and the back of her leg.  States she has a mark around her knee from using the brace that she was given, wants to know if it's normal?   HPI: Overall feeling significantly better. Having persistent posterior knee pain especially after sitting for a prolonged period. No nighttime awakenings. Not having to take medications on a regular basis. She has completed physical therapy but continues to do therapeutic exercises 3-4 days per week. The brace she was previously wearing (now discontinued due to lack of efficacy) was causing some impressions on her skin but no associated pain. No significant lower extremity edema. No symptoms of patellar instability. ROS: Otherwise per HPI.   Clinical History: No specialty comments available.  She reports that she has never smoked. She does not have any smokeless tobacco history on file.  No results for input(s): HGBA1C, LABURIC in the last 8760 hours.  Assessment & Plan: Visit Diagnoses:    ICD-9-CM ICD-10-CM   1. Patellar dislocation, right, subsequent encounter V54.89 S83.004D    836.3      Plan: Overall she does seem be doing well. She does have markedly tight hamstrings I think this is contributing to the posterior pain she is having. We will have her begin working on therapeutic exercises for this including rope stretching this was reviewed. Any persistent symptoms or patellar instability symptoms I'm happy to see her back but otherwise for follow-up as needed. Follow-up: Return if symptoms worsen or fail to improve.  Meds: No orders of the defined types were placed in this encounter.  Procedures: No notes on  file   Objective:  VS:  HT:5\' 6"  (167.6 cm)   WT:215 lb (97.5 kg)  BMI:34.8    BP:117/74  HR:91bpm  TEMP: ( )  RESP:  Physical Exam:  Adult female. Alert and appropriate.  In no acute distress.  Lower extremities are overall well aligned with no significant deformity. No significant swelling.  Distal pulses 2+/4. No significant bruising/ecchymosis or erythema the skin RIGHT knee: Lateral riding patella bilaterally worse on the right area marked patellar hypermobility but negative apprehension test & no subluxation appreciated. Extensor mechanism intact. Hamstrings are markedly tight with a popliteal angle of approximately 40. Improves with PNF type stretching. Ligamentously stable. Anterior posterior drawer & Lachman's are stable. Negative McMurray's. No palpable defect & no appreciable Baker's cyst. Imaging: No results found.  Past Medical/Family/Surgical/Social History: Medications & Allergies reviewed per EMR Patient Active Problem List   Diagnosis Date Noted  . Patellar dislocation, right, subsequent encounter 05/02/2016   No past medical history on file. No family history on file. No past surgical history on file. Social History   Occupational History  . Not on file.   Social History Main Topics  . Smoking status: Never Smoker  . Smokeless tobacco: Not on file  . Alcohol use No  . Drug use: No  . Sexual activity: Not on file

## 2016-06-23 ENCOUNTER — Encounter (HOSPITAL_COMMUNITY): Payer: Self-pay

## 2016-06-23 ENCOUNTER — Emergency Department (HOSPITAL_COMMUNITY)
Admission: EM | Admit: 2016-06-23 | Discharge: 2016-06-23 | Disposition: A | Discharge status: Home / Self Care | Payer: BLUE CROSS/BLUE SHIELD | Attending: Emergency Medicine | Admitting: Emergency Medicine

## 2016-06-23 DIAGNOSIS — M542 Cervicalgia: Secondary | ICD-10-CM

## 2016-06-23 DIAGNOSIS — Y92410 Unspecified street and highway as the place of occurrence of the external cause: Secondary | ICD-10-CM | POA: Insufficient documentation

## 2016-06-23 DIAGNOSIS — S199XXA Unspecified injury of neck, initial encounter: Secondary | ICD-10-CM | POA: Diagnosis not present

## 2016-06-23 DIAGNOSIS — Y999 Unspecified external cause status: Secondary | ICD-10-CM | POA: Insufficient documentation

## 2016-06-23 DIAGNOSIS — Y939 Activity, unspecified: Secondary | ICD-10-CM | POA: Insufficient documentation

## 2016-06-23 MED ORDER — METHOCARBAMOL 500 MG PO TABS: 500.0000 mg | ORAL_TABLET | Freq: Two times a day (BID) | ORAL | 0 refills | Status: DC

## 2016-06-23 MED ORDER — NAPROXEN 500 MG PO TABS
500.0000 mg | ORAL_TABLET | Freq: Two times a day (BID) | ORAL | 0 refills | Status: DC
Start: 2016-06-23 — End: 2017-04-10
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
## 2017-04-10 ENCOUNTER — Ambulatory Visit (HOSPITAL_COMMUNITY)
Admission: EM | Admit: 2017-04-10 | Discharge: 2017-04-10 | Disposition: A | Discharge status: Home / Self Care | Payer: BLUE CROSS/BLUE SHIELD | Attending: Emergency Medicine | Admitting: Emergency Medicine

## 2017-04-10 ENCOUNTER — Encounter (HOSPITAL_COMMUNITY): Payer: Self-pay | Admitting: Emergency Medicine

## 2017-04-10 DIAGNOSIS — Z3202 Encounter for pregnancy test, result negative: Secondary | ICD-10-CM

## 2017-04-10 DIAGNOSIS — B349 Viral infection, unspecified: Secondary | ICD-10-CM | POA: Diagnosis not present

## 2017-04-10 LAB — POCT PREGNANCY, URINE: Preg Test, Ur: NEGATIVE

## 2017-04-10 MED ORDER — NAPROXEN 500 MG PO TABS: 500.0000 mg | ORAL_TABLET | Freq: Two times a day (BID) | ORAL | 0 refills | Status: AC

## 2017-04-10 MED ORDER — CETIRIZINE-PSEUDOEPHEDRINE ER 5-120 MG PO TB12: 1.0000 | ORAL_TABLET | Freq: Every day | ORAL | 0 refills | Status: DC

## 2017-04-10 MED ORDER — FLUTICASONE PROPIONATE 50 MCG/ACT NA SUSP: 2.0000 | Freq: Every day | NASAL | 0 refills | Status: DC

## 2017-04-10 NOTE — ED Provider Notes (Signed)
[facility]    CSN: [id number] Arrival date & time: 04/10/17  1441     History   Chief Complaint Chief Complaint  Patient presents with  . Generalized Body Aches    HPI Katherine Berenice PrimasM Wurtzel is a 26 y.o. female.   26 year old female comes in for 1 day history of sore throat, body aches, nasal congestion/rhinorrhea. Chills without fever. Minimal cough and generalized body aches. Denies ear pain, eye pain. States she has intermittent mid upper back pain for the past month. Has not tried anything for the symptoms.  [employer] and has had positive sick contact. Never smoker.       History reviewed. No pertinent past medical history.  Patient Active Problem List   Diagnosis Date Noted  . Patellar dislocation, right, subsequent encounter 05/02/2016    History reviewed. No pertinent surgical history.  OB History    No data available       Home Medications    Prior to Admission medications   Medication Sig Start Date End Date Taking? Authorizing Provider  acetaminophen (TYLENOL) 325 MG tablet Take 650 mg by mouth every 6 (six) hours as needed for pain.    [provider]  cetirizine-pseudoephedrine (ZYRTEC-D) 5-120 MG tablet Take 1 tablet daily by mouth. 04/10/17   Eagle Pitta V, PA-C  fluticasone (FLONASE) 50 MCG/ACT nasal spray Place 2 sprays daily into both nostrils. 04/10/17   Belinda FisherYu, Marguita Venning V, PA-C  naproxen (NAPROSYN) 500 MG tablet Take 1 tablet (500 mg total) 2 (two) times daily for 10 days by mouth. 04/10/17 04/20/17  Belinda FisherYu, Egon Dittus V, PA-C    Family History No family history on file.  Social History Social History   Tobacco Use  . Smoking status: Never Smoker  . Smokeless tobacco: Never Used  Substance Use Topics  . Alcohol use: No  . Drug use: No     Allergies   Patient has no known allergies.   Review of Systems Review of Systems  Reason unable to perform ROS: See HPI as above.     Physical Exam Triage Vital Signs ED Triage Vitals    Enc Vitals Group     BP 04/10/17 1454 120/67     Pulse Rate 04/10/17 1453 91     Resp 04/10/17 1453 16     Temp 04/10/17 1453 98 F (36.7 C)     Temp Source 04/10/17 1453 Temporal     SpO2 04/10/17 1453 100 %     Weight 04/10/17 1454 210 lb (95.3 kg)     Height 04/10/17 1454 5\' 6"  (1.676 m)     Head Circumference --      Peak Flow --      Pain Score 04/10/17 1454 5     Pain Loc --      Pain Edu? --      Excl. in GC? --    No data found.  Updated Vital Signs BP 120/67   Pulse 91   Temp 98 F (36.7 C) (Temporal)   Resp 16   Ht 5\' 6"  (1.676 m)   Wt 210 lb (95.3 kg)   LMP 02/20/2017   SpO2 100%   BMI 33.89 kg/m   Physical Exam  Constitutional: She is oriented to person, place, and time. She appears well-developed and well-nourished. No distress.  HENT:  Head: Normocephalic and atraumatic.  Right Ear: External ear and ear canal normal. Tympanic membrane is erythematous. Tympanic membrane is not bulging.  Left Ear: External ear and ear canal normal. Tympanic membrane is erythematous. Tympanic membrane is not bulging.  Nose: Mucosal edema and rhinorrhea present. Right sinus exhibits maxillary sinus tenderness. Right sinus exhibits no frontal sinus tenderness. Left sinus exhibits maxillary sinus tenderness. Left sinus exhibits no frontal sinus tenderness.  Mouth/Throat: Uvula is midline, oropharynx is clear and moist and mucous membranes are normal.  Eyes: Conjunctivae are normal. Pupils are equal, round, and reactive to light.  Neck: Normal range of motion. Neck supple.  Cardiovascular: Normal rate, regular rhythm and normal heart sounds. Exam reveals no gallop and no friction rub.  No murmur heard. Pulmonary/Chest: Effort normal and breath sounds normal. She has no decreased breath sounds. She has no wheezes. She has no rhonchi. She has no rales.  Mild rhonchi of left upper field that resolved with cough.  Musculoskeletal:  Diffuse upper back tenderness on palpation. Full  ROM of shoulder.   Lymphadenopathy:    She has no cervical adenopathy.  Neurological: She is alert and oriented to person, place, and time.  Skin: Skin is warm and dry.  Psychiatric: She has a normal mood and affect. Her behavior is normal. Judgment normal.     UC Treatments / Results  Labs (all labs ordered are listed, but only abnormal results are displayed) Labs Reviewed  POCT PREGNANCY, URINE    EKG  EKG Interpretation None       Radiology No results found.  Procedures Procedures (including critical care time)  Medications Ordered in UC Medications - No data to display   Initial Impression / Assessment and Plan / UC Course  I have reviewed the triage vital signs and the nursing notes.  Pertinent labs & imaging results that were available during my care of the patient were reviewed by me and considered in my medical decision making (see chart for details).    Discussed with patient history and exam most consistent with viral URI. Symptomatic treatment as needed. Push fluids. Naproxen for body aches. If back pain continues after viral illness resolves and not improved with naproxen, to follow up with PCP for further work up. Return precautions given.   Final Clinical Impressions(s) / UC Diagnoses   Final diagnoses:  Viral syndrome    ED Discharge Orders        Ordered    naproxen (NAPROSYN) 500 MG tablet  2 times daily     04/10/17 1612    fluticasone (FLONASE) 50 MCG/ACT nasal spray  Daily     04/10/17 1612    cetirizine-pseudoephedrine (ZYRTEC-D) 5-120 MG tablet  Daily     04/10/17 1612        Belinda FisherYu, Aryannah Mohon V, PA-C 04/10/17 1622

## 2017-04-10 NOTE — ED Triage Notes (Signed)
PT reports sore throat, body aches, congestion that started today.

## 2017-04-10 NOTE — Discharge Instructions (Signed)
Start flonase, zyrtec-D for nasal congestion. You can use over the counter nasal saline rinse such as neti pot for nasal congestion. Keep hydrated, your urine should be clear to pale yellow in color. Tylenol/motrin for fever and pain. Monitor for any worsening of symptoms, chest pain, shortness of breath, wheezing, swelling of the throat, follow up for reevaluation.  ° °For sore throat try using a honey-based tea. Use 3 teaspoons of honey with juice squeezed from half lemon. Place shaved pieces of ginger into 1/2-1 cup of water and warm over stove top. Then mix the ingredients and repeat every 4 hours as needed. °

## 2017-04-14 ENCOUNTER — Ambulatory Visit (HOSPITAL_COMMUNITY)
Admission: EM | Admit: 2017-04-14 | Discharge: 2017-04-14 | Disposition: A | Discharge status: Home / Self Care | Payer: BLUE CROSS/BLUE SHIELD | Attending: Internal Medicine | Admitting: Internal Medicine

## 2017-04-14 ENCOUNTER — Other Ambulatory Visit: Payer: Self-pay

## 2017-04-14 ENCOUNTER — Encounter (HOSPITAL_COMMUNITY): Payer: Self-pay | Admitting: Emergency Medicine

## 2017-04-14 DIAGNOSIS — Z79899 Other long term (current) drug therapy: Secondary | ICD-10-CM | POA: Diagnosis not present

## 2017-04-14 DIAGNOSIS — B349 Viral infection, unspecified: Secondary | ICD-10-CM

## 2017-04-14 DIAGNOSIS — J029 Acute pharyngitis, unspecified: Secondary | ICD-10-CM | POA: Diagnosis present

## 2017-04-14 LAB — POCT RAPID STREP A: Streptococcus, Group A Screen (Direct): NEGATIVE

## 2017-04-14 MED ORDER — FLUCONAZOLE 150 MG PO TABS: 150.0000 mg | ORAL_TABLET | Freq: Every day | ORAL | 0 refills | Status: DC

## 2017-04-14 MED ORDER — IPRATROPIUM BROMIDE 0.06 % NA SOLN: 2.0000 | Freq: Four times a day (QID) | NASAL | 0 refills | Status: DC

## 2017-04-14 MED ORDER — AMOXICILLIN-POT CLAVULANATE 875-125 MG PO TABS: 1.0000 | ORAL_TABLET | Freq: Two times a day (BID) | ORAL | 0 refills | Status: AC

## 2017-04-14 MED ORDER — PHENOL 1.4 % MT LIQD: 1.0000 | OROMUCOSAL | 0 refills | Status: DC | PRN

## 2017-04-14 NOTE — ED Provider Notes (Signed)
[facility]    CSN: [id number] Arrival date & time: 04/14/17  [id number]     History   Chief Complaint Chief Complaint  Patient presents with  . Sore Throat    HPI Katherine Fischer is a 26 y.o. female.   26 year old female returns for evaluation after visit 4 days ago. At that time, symptoms most consistent with viral URI and was given symptomatic treatment. She has been following regimen with good compliance with good relief with nasal congestion and sinus pressure. However, she has been having worsening sore throat. States she thought it was due to post nasal drip, but sore throat continues to worsen and came in for evaluation. She states she has been taking around the clock ibuprofen and therefore does not know if she would have had a fever. Denies trouble breathing, trouble swallowing, voice changes.       History reviewed. No pertinent past medical history.  Patient Active Problem List   Diagnosis Date Noted  . Patellar dislocation, right, subsequent encounter 05/02/2016    History reviewed. No pertinent surgical history.  OB History    No data available       Home Medications    Prior to Admission medications   Medication Sig Start Date End Date Taking? Authorizing Provider  acetaminophen (TYLENOL) 325 MG tablet Take 650 mg by mouth every 6 (six) hours as needed for pain.    [provider]  amoxicillin-clavulanate (AUGMENTIN) 875-125 MG tablet Take 1 tablet by mouth every 12 (twelve) hours for 7 days. Do not fill till 11/28. Patient can take after 11/28 if symptoms still persist 04/19/17 04/26/17  Belinda FisherYu, Amy V, PA-C  cetirizine-pseudoephedrine (ZYRTEC-D) 5-120 MG tablet Take 1 tablet daily by mouth. 04/10/17   Cathie HoopsYu, Amy V, PA-C  fluticasone (FLONASE) 50 MCG/ACT nasal spray Place 2 sprays daily into both nostrils. 04/10/17   Cathie HoopsYu, Amy V, PA-C  ipratropium (ATROVENT) 0.06 % nasal spray Place 2 sprays into both nostrils 4 (four) times daily. 04/14/17   Cathie HoopsYu, Amy  V, PA-C  naproxen (NAPROSYN) 500 MG tablet Take 1 tablet (500 mg total) 2 (two) times daily for 10 days by mouth. 04/10/17 04/20/17  Cathie HoopsYu, Amy V, PA-C  phenol (CHLORASEPTIC) 1.4 % LIQD Use as directed 1 spray in the mouth or throat as needed for throat irritation / pain. 04/14/17   Belinda FisherYu, Amy V, PA-C    Family History No family history on file.  Social History Social History   Tobacco Use  . Smoking status: Never Smoker  . Smokeless tobacco: Never Used  Substance Use Topics  . Alcohol use: No  . Drug use: No     Allergies   Patient has no known allergies.   Review of Systems Review of Systems  Reason unable to perform ROS: See HPI as above.     Physical Exam Triage Vital Signs ED Triage Vitals  Enc Vitals Group     BP 04/14/17 1005 (!) 117/54     Pulse Rate 04/14/17 1005 67     Resp 04/14/17 1005 16     Temp 04/14/17 1005 98.2 F (36.8 C)     Temp src --      SpO2 04/14/17 1005 100 %     Weight --      Height --      Head Circumference --      Peak Flow --      Pain Score 04/14/17 1007 5     Pain  Loc --      Pain Edu? --      Excl. in GC? --    No data found.  Updated Vital Signs BP (!) 117/54   Pulse 67   Temp 98.2 F (36.8 C)   Resp 16   SpO2 100%   Physical Exam  Constitutional: She is oriented to person, place, and time. She appears well-developed and well-nourished. No distress.  HENT:  Head: Normocephalic and atraumatic.  Right Ear: Tympanic membrane, external ear and ear canal normal. Tympanic membrane is not erythematous and not bulging.  Left Ear: Tympanic membrane, external ear and ear canal normal. Tympanic membrane is not erythematous and not bulging.  Nose: Rhinorrhea present. Right sinus exhibits maxillary sinus tenderness. Right sinus exhibits no frontal sinus tenderness. Left sinus exhibits maxillary sinus tenderness. Left sinus exhibits no frontal sinus tenderness.  Mouth/Throat: Uvula is midline, oropharynx is clear and moist and mucous  membranes are normal. Tonsils are 2+ on the right. Tonsils are 2+ on the left. Tonsillar exudate.  Eyes: Conjunctivae are normal. Pupils are equal, round, and reactive to light.  Neck: Normal range of motion. Neck supple.  Cardiovascular: Normal rate, regular rhythm and normal heart sounds. Exam reveals no gallop and no friction rub.  No murmur heard. Pulmonary/Chest: Effort normal and breath sounds normal. She has no decreased breath sounds. She has no wheezes. She has no rhonchi. She has no rales.  Lymphadenopathy:    She has no cervical adenopathy.  Neurological: She is alert and oriented to person, place, and time.  Skin: Skin is warm and dry.  Psychiatric: She has a normal mood and affect. Her behavior is normal. Judgment normal.     UC Treatments / Results  Labs (all labs ordered are listed, but only abnormal results are displayed) Labs Reviewed  CULTURE, GROUP A STREP [facility]  POCT RAPID STREP A    EKG  EKG Interpretation None       Radiology No results found.  Procedures Procedures (including critical care time)  Medications Ordered in UC Medications - No data to display   Initial Impression / Assessment and Plan / UC Course  I have reviewed the triage vital signs and the nursing notes.  Pertinent labs & imaging results that were available during my care of the patient were reviewed by me and considered in my medical decision making (see chart for details).    Rapid strep negative. Symptomatic treatment as needed. Discussed can fill Augmentin in 5-6 days if symptoms not improving for sinusitis. Return precautions given.   Final Clinical Impressions(s) / UC Diagnoses   Final diagnoses:  Viral illness    ED Discharge Orders        Ordered    amoxicillin-clavulanate (AUGMENTIN) 875-125 MG tablet  Every 12 hours     04/14/17 1028    ipratropium (ATROVENT) 0.06 % nasal spray  4 times daily     04/14/17 1028    phenol (CHLORASEPTIC) 1.4 % LIQD  As needed       04/14/17 1028        Belinda FisherYu, Amy V, PA-C 04/14/17 1031

## 2017-04-14 NOTE — ED Triage Notes (Signed)
Pt c/o sore throat, coughing. Sinus congestion. Pt seen here four days ago with strep negative. Pt here symptoms aren't getting better.

## 2017-04-14 NOTE — Discharge Instructions (Signed)
Rapid strep negative. Start phenol for sore throat. Can continue zyrtec-D and flonase, start atrovent nasal spray for nasal congestion. You can use over the counter nasal saline rinse such as neti pot for nasal congestion. Monitor for any worsening of symptoms, swelling of the throat, trouble breathing, trouble swallowing, follow up for reevaluation.   If sinus symptoms do not improve in 5-6 days, can fill Augmentin for sinus infection. Second throat swab will be sent to culture, you will receive a call if positive, antibiotics needed will be sent in then, if so, please do not take Augmentin.

## 2017-04-16 LAB — CULTURE, GROUP A STREP (THRC)

## 2017-10-25 ENCOUNTER — Ambulatory Visit: Payer: Self-pay | Admitting: Nurse Practitioner

## 2017-10-25 DIAGNOSIS — Z111 Encounter for screening for respiratory tuberculosis: Secondary | ICD-10-CM

## 2017-10-27 LAB — TB SKIN TEST
INDURATION: 0 mm
TB Skin Test: NEGATIVE

## 2017-10-27 NOTE — Progress Notes (Signed)
Patient is here to have TB skin test read - Left forearm.  Results: Negative 0 induration.

## 2018-03-07 ENCOUNTER — Encounter: Payer: Self-pay | Admitting: Family Medicine

## 2018-03-07 ENCOUNTER — Ambulatory Visit: Payer: Self-pay | Admitting: Family Medicine

## 2018-03-07 VITALS — BP 92/60 | HR 71 | Temp 98.6°F | Wt 182.8 lb

## 2018-03-07 DIAGNOSIS — R059 Cough, unspecified: Secondary | ICD-10-CM

## 2018-03-07 DIAGNOSIS — J069 Acute upper respiratory infection, unspecified: Secondary | ICD-10-CM

## 2018-03-07 DIAGNOSIS — R05 Cough: Secondary | ICD-10-CM

## 2018-03-07 MED ORDER — PSEUDOEPH-BROMPHEN-DM 30-2-10 MG/5ML PO SYRP: 10.0000 mL | ORAL_SOLUTION | Freq: Three times a day (TID) | ORAL | 0 refills | Status: DC | PRN

## 2018-03-07 NOTE — Patient Instructions (Signed)
Upper Respiratory Infection, Adult Most upper respiratory infections (URIs) are caused by a virus. A URI affects the nose, throat, and upper air passages. The most common type of URI is often called "the common cold." Follow these instructions at home:  Take medicines only as told by your doctor.  Gargle warm saltwater or take cough drops to comfort your throat as told by your doctor.  Use a warm mist humidifier or inhale steam from a shower to increase air moisture. This may make it easier to breathe.  Drink enough fluid to keep your pee (urine) clear or pale yellow.  Eat soups and other clear broths.  Have a healthy diet.  Rest as needed.  Go back to work when your fever is gone or your doctor says it is okay. ? You may need to stay home longer to avoid giving your URI to others. ? You can also wear a face mask and wash your hands often to prevent spread of the virus.  Use your inhaler more if you have asthma.  Do not use any tobacco products, including cigarettes, chewing tobacco, or electronic cigarettes. If you need help quitting, ask your doctor. Contact a doctor if:  You are getting worse, not better.  Your symptoms are not helped by medicine.  You have chills.  You are getting more short of breath.  You have brown or red mucus.  You have yellow or brown discharge from your nose.  You have pain in your face, especially when you bend forward.  You have a fever.  You have puffy (swollen) neck glands.  You have pain while swallowing.  You have white areas in the back of your throat. Get help right away if:  You have very bad or constant: ? Headache. ? Ear pain. ? Pain in your forehead, behind your eyes, and over your cheekbones (sinus pain). ? Chest pain.  You have long-lasting (chronic) lung disease and any of the following: ? Wheezing. ? Long-lasting cough. ? Coughing up blood. ? A change in your usual mucus.  You have a stiff neck.  You have  changes in your: ? Vision. ? Hearing. ? Thinking. ? Mood. This information is not intended to replace advice given to you by your health care provider. Make sure you discuss any questions you have with your health care provider. Document Released: 10/26/2007 Document Revised: 01/10/2016 Document Reviewed: 08/14/2013 Elsevier Interactive Patient Education  2018 [employer]  

## 2018-03-07 NOTE — Progress Notes (Signed)
Katherine Fischer is a 27 y.o. female who presents today with concerns of fever, chills and runny nose for the past 3 days. 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 She reports that over the past 3 days her symptoms have come and gone with some moments left her feeling fine and other times she feels ill and feverish. She denies any home contacts with similar symptoms.   Review of Systems  Constitutional: Negative for chills, fever and malaise/fatigue.  HENT: Positive for congestion, ear pain and sore throat. Negative for ear discharge and sinus pain.   Eyes: Negative.   Respiratory: Positive for cough. Negative for sputum production and shortness of breath.   Cardiovascular: Negative.  Negative for chest pain.  Gastrointestinal: Negative for abdominal pain, diarrhea, nausea and vomiting.  Genitourinary: Negative for dysuria, frequency, hematuria and urgency.  Musculoskeletal: Negative for myalgias.  Skin: Negative.   Neurological: Negative for headaches.  Endo/Heme/Allergies: Negative.   Psychiatric/Behavioral: Negative.     O: Vitals:   03/07/18 0937  BP: 92/60  Pulse: 71  Temp: 98.6 F (37 C)  SpO2: 98%     Physical Exam  Constitutional: She is oriented to person, place, and time. Vital signs are normal. She appears well-developed and well-nourished. She is active.  Non-toxic appearance. She does not have a sickly appearance. She appears ill.  HENT:  Head: Normocephalic.  Right Ear: Hearing, tympanic membrane, external ear and ear canal normal.  Left Ear: Hearing, external ear and ear canal normal. A middle ear effusion is present.  Nose: Rhinorrhea present. Right sinus exhibits no maxillary sinus tenderness and no frontal sinus tenderness. Left sinus exhibits no maxillary sinus tenderness and no frontal sinus tenderness.  Mouth/Throat: Uvula is midline and oropharynx is clear and moist.  Increased vascularity-  TM otherwise WNL  Neck: Normal range of motion. Neck supple.  Cardiovascular: Normal rate, regular rhythm, normal heart sounds and normal pulses.  Pulmonary/Chest: Effort normal and breath sounds normal.  Abdominal: Soft. Bowel sounds are normal.  Musculoskeletal: Normal range of motion.  Lymphadenopathy:       Head (right side): Tonsillar adenopathy present. No submental and no submandibular adenopathy present.       Head (left side): Tonsillar adenopathy present. No submental and no submandibular adenopathy present.    She has cervical adenopathy.  Neurological: She is alert and oriented to person, place, and time.  Psychiatric: She has a normal mood and affect.  Vitals reviewed.  A: 1. Cough   2. Upper respiratory tract infection, unspecified type    P: Discussed exam findings, diagnosis etiology and medication use and indications reviewed with patient. Follow- Up and discharge instructions provided. No emergent/urgent issues found on exam.  Patient verbalized understanding of information provided and agrees with plan of care (POC), all questions answered.  PLAN:  Discussed symptomatic care and rest. Symptoms wax and wane will consider abx if symptoms not improved in 48 hours (day 6 of symptoms).  1. Cough - brompheniramine-pseudoephedrine-DM 30-2-10 MG/5ML syrup; Take 10 mLs by mouth 3 (three) times daily as needed.  2. Upper respiratory tract infection, unspecified type - brompheniramine-pseudoephedrine-DM 30-2-10 MG/5ML syrup; Take 10 mLs by mouth 3 (three) times daily as needed.

## 2022-05-08 ENCOUNTER — Emergency Department (HOSPITAL_BASED_OUTPATIENT_CLINIC_OR_DEPARTMENT_OTHER)
Admission: EM | Admit: 2022-05-08 | Discharge: 2022-05-08 | Disposition: A | Discharge status: Home / Self Care | Payer: BC Managed Care – PPO | Attending: Emergency Medicine | Admitting: Emergency Medicine

## 2022-05-08 ENCOUNTER — Emergency Department (HOSPITAL_BASED_OUTPATIENT_CLINIC_OR_DEPARTMENT_OTHER): Payer: BC Managed Care – PPO

## 2022-05-08 ENCOUNTER — Emergency Department (HOSPITAL_BASED_OUTPATIENT_CLINIC_OR_DEPARTMENT_OTHER): Payer: BC Managed Care – PPO | Admitting: Radiology

## 2022-05-08 DIAGNOSIS — J1100 Influenza due to unidentified influenza virus with unspecified type of pneumonia: Secondary | ICD-10-CM | POA: Insufficient documentation

## 2022-05-08 DIAGNOSIS — M791 Myalgia, unspecified site: Secondary | ICD-10-CM | POA: Diagnosis present

## 2022-05-08 DIAGNOSIS — R000 Tachycardia, unspecified: Secondary | ICD-10-CM | POA: Insufficient documentation

## 2022-05-08 DIAGNOSIS — D72819 Decreased white blood cell count, unspecified: Secondary | ICD-10-CM | POA: Diagnosis not present

## 2022-05-08 LAB — CBC WITH DIFFERENTIAL/PLATELET
Abs Immature Granulocytes: 0.01 10*3/uL (ref 0.00–0.07)
Basophils Absolute: 0 10*3/uL (ref 0.0–0.1)
Basophils Relative: 0 %
Eosinophils Absolute: 0 10*3/uL (ref 0.0–0.5)
Eosinophils Relative: 0 %
HCT: 38.5 % (ref 36.0–46.0)
Hemoglobin: 12.9 g/dL (ref 12.0–15.0)
Immature Granulocytes: 0 %
Lymphocytes Relative: 20 %
Lymphs Abs: 0.7 10*3/uL (ref 0.7–4.0)
MCH: 28 pg (ref 26.0–34.0)
MCHC: 33.5 g/dL (ref 30.0–36.0)
MCV: 83.7 fL (ref 80.0–100.0)
Monocytes Absolute: 0.2 10*3/uL (ref 0.1–1.0)
Monocytes Relative: 6 %
Neutro Abs: 2.5 10*3/uL (ref 1.7–7.7)
Neutrophils Relative %: 74 %
Platelets: 250 10*3/uL (ref 150–400)
RBC: 4.6 MIL/uL (ref 3.87–5.11)
RDW: 13.6 % (ref 11.5–15.5)
WBC: 3.3 10*3/uL — ABNORMAL LOW (ref 4.0–10.5)
nRBC: 0 % (ref 0.0–0.2)

## 2022-05-08 LAB — URINALYSIS, ROUTINE W REFLEX MICROSCOPIC
Bilirubin Urine: NEGATIVE
Glucose, UA: NEGATIVE mg/dL
Ketones, ur: 15 mg/dL — AB
Leukocytes,Ua: NEGATIVE
Nitrite: NEGATIVE
Protein, ur: 30 mg/dL — AB
RBC / HPF: >50 RBC/hpf — ABNORMAL HIGH (ref 0–5)
Specific Gravity, Urine: 1.031 — ABNORMAL HIGH (ref 1.005–1.030)
pH: 6 (ref 5.0–8.0)

## 2022-05-08 LAB — COMPREHENSIVE METABOLIC PANEL
ALT: 15 U/L (ref 0–44)
AST: 20 U/L (ref 15–41)
Albumin: 4.2 g/dL (ref 3.5–5.0)
Alkaline Phosphatase: 59 U/L (ref 38–126)
Anion gap: 13 (ref 5–15)
BUN: 11 mg/dL (ref 6–20)
CO2: 24 mmol/L (ref 22–32)
Calcium: 8.6 mg/dL — ABNORMAL LOW (ref 8.9–10.3)
Chloride: 103 mmol/L (ref 98–111)
Creatinine, Ser: 0.7 mg/dL (ref 0.44–1.00)
GFR, Estimated: >60 mL/min (ref >60)
Glucose, Bld: 114 mg/dL — ABNORMAL HIGH (ref 70–99)
Potassium: 3.2 mmol/L — ABNORMAL LOW (ref 3.5–5.1)
Sodium: 140 mmol/L (ref 135–145)
Total Bilirubin: 0.3 mg/dL (ref 0.3–1.2)
Total Protein: 7.5 g/dL (ref 6.5–8.1)

## 2022-05-08 LAB — PREGNANCY, URINE: Preg Test, Ur: NEGATIVE

## 2022-05-08 MED ORDER — SODIUM CHLORIDE 0.9 % IV BOLUS
1000.0000 mL | Freq: Once | INTRAVENOUS | Status: AC
  Administered 2022-05-08: 1000 mL via INTRAVENOUS

## 2022-05-08 MED ORDER — BENZONATATE 100 MG PO CAPS: 100.0000 mg | ORAL_CAPSULE | Freq: Three times a day (TID) | ORAL | 0 refills | Status: DC

## 2022-05-08 MED ORDER — IBUPROFEN 400 MG PO TABS
600.0000 mg | ORAL_TABLET | Freq: Once | ORAL | Status: AC
  Administered 2022-05-08: 600 mg via ORAL
  Filled 2022-05-08: qty 1

## 2022-05-08 MED ORDER — AMOXICILLIN-POT CLAVULANATE 875-125 MG PO TABS: 1.0000 | ORAL_TABLET | Freq: Two times a day (BID) | ORAL | 0 refills | Status: AC

## 2022-05-08 MED ORDER — ONDANSETRON HCL 4 MG/2ML IJ SOLN
4.0000 mg | Freq: Once | INTRAMUSCULAR | Status: AC
  Administered 2022-05-08: 4 mg via INTRAVENOUS
  Filled 2022-05-08: qty 2

## 2022-05-08 MED ORDER — ACETAMINOPHEN 325 MG PO TABS
650.0000 mg | ORAL_TABLET | Freq: Once | ORAL | Status: AC
  Administered 2022-05-08: 650 mg via ORAL
  Filled 2022-05-08: qty 2

## 2022-05-08 NOTE — ED Notes (Signed)
Pt aware urine sample is needed.  Pt says she should be able to provide one after receiving fluid bolus.

## 2022-05-08 NOTE — ED Notes (Signed)
Discharge paperwork given and verbally understood. 

## 2022-05-08 NOTE — ED Triage Notes (Signed)
Onset wednesday of aches and pain.  Tested positive for flu.  Having mensal cramps  Nausea  States having muscle cramps all over  Cough chills  Multiple complains

## 2022-05-08 NOTE — ED Provider Notes (Signed)
[facility] EMERGENCY DEPT Provider Note   CSN: [id number] Arrival date & time: 05/08/22  1131     History {Add pertinent medical, surgical, social history, OB history to HPI:1} Chief Complaint  Patient presents with   Spasms    Katherine Fischer is a 31 y.o. female.  Wednesday had body aches. Last night felt okay. Able to eat. This morning and woke up and feeling poorly.  Having muscle spasms and very drained and out of breath. Hot and cold chills  Diagnosed with flu  Has been taking mucinex and sudafed.  Fever on Friday and yesterday. 101.  HPI     Home Medications Prior to Admission medications   Medication Sig Start Date End Date Taking? Authorizing Provider  acetaminophen (TYLENOL) 325 MG tablet Take 650 mg by mouth every 6 (six) hours as needed for pain.    [provider]  brompheniramine-pseudoephedrine-DM 30-2-10 MG/5ML syrup Take 10 mLs by mouth 3 (three) times daily as needed. 03/07/18   Zachery Dauer, NP  cetirizine-pseudoephedrine (ZYRTEC-D) 5-120 MG tablet Take 1 tablet daily by mouth. 04/10/17   Cathie Hoops, Amy V, PA-C  fluconazole (DIFLUCAN) 150 MG tablet Take 1 tablet (150 mg total) by mouth daily. Take 2nd dose 72 hours later if symptoms still persists. Fill in 5 days if Augmentin filled. 04/19/17   Cathie Hoops, Amy V, PA-C  fluticasone (FLONASE) 50 MCG/ACT nasal spray Place 2 sprays daily into both nostrils. 04/10/17   Cathie Hoops, Amy V, PA-C  ipratropium (ATROVENT) 0.06 % nasal spray Place 2 sprays into both nostrils 4 (four) times daily. 04/14/17   Cathie Hoops, Amy V, PA-C  phenol (CHLORASEPTIC) 1.4 % LIQD Use as directed 1 spray in the mouth or throat as needed for throat irritation / pain. 04/14/17   Belinda Fisher, PA-C  promethazine (PHENERGAN) 25 MG tablet Take 1 tablet (25 mg total) by mouth every 6 (six) hours as needed for nausea. 05/24/12 05/29/13  Gilda Crease, MD  sodium chloride (OCEAN) 0.65 % SOLN nasal spray Place 1 spray into the nose as needed for  congestion. 02/21/13 05/29/13  Cathlyn Parsons, NP      Allergies    Patient has no known allergies.    Review of Systems   Review of Systems  Physical Exam Updated Vital Signs BP (!) 98/48 (BP Location: Right Arm)   Pulse 78   Temp 98.4 F (36.9 C) (Oral)   Resp 20   Ht 5\' 6"  (1.676 m)   Wt 106.1 kg   LMP 05/05/2022 (Approximate)   SpO2 97%   BMI 37.77 kg/m  Physical Exam  ED Results / Procedures / Treatments   Labs (all labs ordered are listed, but only abnormal results are displayed) Labs Reviewed - No data to display  EKG None  Radiology No results found.  Procedures Procedures    Medications Ordered in ED Medications - No data to display  ED Course/ Medical Decision Making/ A&P                           Medical Decision Making  ***  {Document critical care time when appropriate:1} {Document review of labs and clinical decision tools ie heart score, Chads2Vasc2 etc:1}  {Document your independent review of radiology images, and any outside records:1} {Document your discussion with family members, caretakers, and with consultants:1} {Document social determinants of health affecting pt's care:1} {Document your decision making why or why not admission, treatments were needed:1} Final  Clinical Impression(s) / ED Diagnoses Final diagnoses:  None    Rx / DC Orders ED Discharge Orders     None

## 2022-05-08 NOTE — Discharge Instructions (Signed)
You were seen in the emergency department today for cough and shortness of breath.  You do have pneumonia and influenza.  I am placing you on antibiotics that you will take twice a day over the next 5 days.  Please return to emergency department if you have significantly worsening shortness of breath or difficulty breathing.  Otherwise please follow-up with your primary care provider midweek next week to check in on your symptoms.  I am also prescribing a medication for cough called benzonatate.  You can use this every 8 hours.

## 2023-02-24 LAB — OB RESULTS CONSOLE GC/CHLAMYDIA
Chlamydia: NEGATIVE
Neisseria Gonorrhea: NEGATIVE

## 2023-02-24 LAB — OB RESULTS CONSOLE ANTIBODY SCREEN: Antibody Screen: NEGATIVE

## 2023-02-24 LAB — OB RESULTS CONSOLE RPR: RPR: NONREACTIVE

## 2023-02-24 LAB — OB RESULTS CONSOLE HEPATITIS B SURFACE ANTIGEN: Hepatitis B Surface Ag: NEGATIVE

## 2023-02-24 LAB — OB RESULTS CONSOLE RUBELLA ANTIBODY, IGM: Rubella: IMMUNE

## 2023-02-24 LAB — HEPATITIS C ANTIBODY: HCV Ab: NEGATIVE

## 2023-02-24 LAB — OB RESULTS CONSOLE HIV ANTIBODY (ROUTINE TESTING): HIV: NONREACTIVE

## 2023-05-12 ENCOUNTER — Ambulatory Visit (HOSPITAL_COMMUNITY)
Admission: EM | Admit: 2023-05-12 | Discharge: 2023-05-12 | Disposition: A | Discharge status: Home / Self Care | Payer: Medicaid Other | Attending: Nurse Practitioner | Admitting: Nurse Practitioner

## 2023-05-12 ENCOUNTER — Encounter (HOSPITAL_COMMUNITY): Payer: Self-pay

## 2023-05-12 DIAGNOSIS — R0602 Shortness of breath: Secondary | ICD-10-CM | POA: Diagnosis not present

## 2023-05-12 DIAGNOSIS — R0789 Other chest pain: Secondary | ICD-10-CM | POA: Diagnosis not present

## 2023-05-12 DIAGNOSIS — J069 Acute upper respiratory infection, unspecified: Secondary | ICD-10-CM | POA: Diagnosis not present

## 2023-05-12 NOTE — ED Provider Notes (Signed)
[facility]    CSN: [id number] Arrival date & time: 05/12/23  2725      History   Chief Complaint Chief Complaint  Patient presents with   Nasal Congestion    HPI Katherine Fischer is a 32 y.o. female.   Patient presents today with 3-week history of shortness of breath, wheezing that is worse at nighttime, chest tightness when taking a deep breath, stuffy nose, decreased appetite, and fatigue.  Reports symptoms began around Thanksgiving and some of the symptoms have improved, but the shortness of breath and chest tightness have worsened in the past day or so.  She denies fever, body aches or chills, cough, wheezing, runny nose, sore throat, headache or ear pain, nausea/vomiting, and diarrhea.  Patient is [redacted] weeks pregnant, denies vaginal discharge or vaginal bleeding.  Reports normal fetal movement.  Initially was taking Benadryl and decongestant but stopped taking the decongestant per instructions from her OB/GYN.  Not currently taking anything for symptoms so far.    History reviewed. No pertinent past medical history.  Patient Active Problem List   Diagnosis Date Noted   Patellar dislocation, right, subsequent encounter 05/02/2016    History reviewed. No pertinent surgical history.  OB History     Gravida  1   Para      Term      Preterm      AB      Living         SAB      IAB      Ectopic      Multiple      Live Births               Home Medications    Prior to Admission medications   Medication Sig Start Date End Date Taking? Authorizing Provider  acetaminophen (TYLENOL) 325 MG tablet Take 650 mg by mouth every 6 (six) hours as needed for pain.    [provider]  promethazine (PHENERGAN) 25 MG tablet Take 1 tablet (25 mg total) by mouth every 6 (six) hours as needed for nausea. 05/24/12 05/29/13  Gilda Crease, MD  sodium chloride (OCEAN) 0.65 % SOLN nasal spray Place 1 spray into the nose as needed for congestion.  02/21/13 05/29/13  Cathlyn Parsons, NP    Family History History reviewed. No pertinent family history.  Social History Social History   Tobacco Use   Smoking status: Never   Smokeless tobacco: Never  Vaping Use   Vaping status: Never Used  Substance Use Topics   Alcohol use: No   Drug use: No     Allergies   Patient has no known allergies.   Review of Systems Review of Systems Per HPI  Physical Exam Triage Vital Signs ED Triage Vitals  Encounter Vitals Group     BP 05/12/23 0955 114/74     Systolic BP Percentile --      Diastolic BP Percentile --      Pulse Rate 05/12/23 0955 79     Resp 05/12/23 0955 16     Temp 05/12/23 0955 98.9 F (37.2 C)     Temp Source 05/12/23 0955 Oral     SpO2 05/12/23 0955 98 %     Weight 05/12/23 0955 230 lb (104.3 kg)     Height 05/12/23 0955 5\' 6"  (1.676 m)     Head Circumference --      Peak Flow --      Pain Score 05/12/23 0954  5     Pain Loc --      Pain Education --      Exclude from Growth Chart --    No data found.  Updated Vital Signs BP 114/74 (BP Location: Left Arm)   Pulse 79   Temp 98.9 F (37.2 C) (Oral)   Resp 16   Ht 5\' 6"  (1.676 m)   Wt 230 lb (104.3 kg)   LMP 11/20/2022 (Exact Date)   SpO2 98%   BMI 37.12 kg/m   Visual Acuity Right Eye Distance:   Left Eye Distance:   Bilateral Distance:    Right Eye Near:   Left Eye Near:    Bilateral Near:     Physical Exam Vitals and nursing note reviewed.  Constitutional:      General: She is not in acute distress.    Appearance: Normal appearance. She is not ill-appearing or toxic-appearing.  HENT:     Head: Normocephalic and atraumatic.     Right Ear: Tympanic membrane, ear canal and external ear normal.     Left Ear: Tympanic membrane, ear canal and external ear normal.     Nose: No congestion or rhinorrhea.     Mouth/Throat:     Mouth: Mucous membranes are moist.     Pharynx: Oropharynx is clear. No oropharyngeal exudate or posterior  oropharyngeal erythema.  Eyes:     General: No scleral icterus.    Extraocular Movements: Extraocular movements intact.  Cardiovascular:     Rate and Rhythm: Normal rate and regular rhythm.  Pulmonary:     Effort: Pulmonary effort is normal. No respiratory distress.     Breath sounds: Normal breath sounds. No wheezing, rhonchi or rales.  Musculoskeletal:     Cervical back: Normal range of motion and neck supple.  Lymphadenopathy:     Cervical: No cervical adenopathy.  Skin:    General: Skin is warm and dry.     Coloration: Skin is not jaundiced or pale.     Findings: No erythema or rash.  Neurological:     Mental Status: She is alert and oriented to person, place, and time.  Psychiatric:        Behavior: Behavior is cooperative.      UC Treatments / Results  Labs (all labs ordered are listed, but only abnormal results are displayed) Labs Reviewed - No data to display  EKG   Radiology No results found.  Procedures Procedures (including critical care time)  Medications Ordered in UC Medications - No data to display  Initial Impression / Assessment and Plan / UC Course  I have reviewed the triage vital signs and the nursing notes.  Pertinent labs & imaging results that were available during my care of the patient were reviewed by me and considered in my medical decision making (see chart for details).   Patient is well-appearing, normotensive, afebrile, not tachycardic, not tachypneic, oxygenating well on room air.    1. Viral upper respiratory tract infection 2. Shortness of breath 3. Chest tightness Suspect postviral cough from likely upper respiratory infection Viral testing deferred given length of symptoms EKG performed today given new chest tightness that shows normal sinus rhythm without significant ST segment or T wave abnormalities No previous EKG to compare to Vitals are stable and lungs are clear to auscultation bilaterally; low suspicion for PE or  pneumonia Recommended supportive care with Mucinex, dextromethorphan, other supportive measures safe in pregnancy-handout given Recommended close follow-up with OB/GYN if symptoms do not fully  improve with treatment Strict ER precautions discussed with patient  The patient was given the opportunity to ask questions.  All questions answered to their satisfaction.  The patient is in agreement to this plan.   Final Clinical Impressions(s) / UC Diagnoses   Final diagnoses:  Viral upper respiratory tract infection  Shortness of breath  Chest tightness     Discharge Instructions      EKG today looks great.  Your vital signs also look great and your lungs sound clear when I listen to them.  Recommend starting Mucinex 600 mg twice daily to help break up any mucus in appetite, easier.  We decided not to do any imaging of your chest, however if your symptoms do not improve in the next couple of weeks or if they worsen, please seek care so this can be reevaluated.  OTC Medications safe in pregnancy: - acetaminophen - cepacol throat lozenges - Coricidin HBP: chest congestion/cough, cold/flu - alcohol free cough drops - dextromethorphan - guaifenesin - Neti pot/saline nasal rinses - Vicks vapo rub     ED Prescriptions   None    PDMP not reviewed this encounter.   Valentino Nose, NP 05/12/23 [phone]

## 2023-05-12 NOTE — ED Triage Notes (Signed)
Patient here today with c/o SOB and nasal congestion X 1 month. Patient is [redacted] weeks pregnant.   Patient started having some tightness and soreness in the mid part of her chest yesterday.

## 2023-05-12 NOTE — Discharge Instructions (Addendum)
EKG today looks great.  Your vital signs also look great and your lungs sound clear when I listen to them.  Recommend starting Mucinex 600 mg twice daily to help break up any mucus in appetite, easier.  We decided not to do any imaging of your chest, however if your symptoms do not improve in the next couple of weeks or if they worsen, please seek care so this can be reevaluated.  OTC Medications safe in pregnancy: - acetaminophen - cepacol throat lozenges - Coricidin HBP: chest congestion/cough, cold/flu - alcohol free cough drops - dextromethorphan - guaifenesin - Neti pot/saline nasal rinses - Vicks vapo rub

## 2023-05-24 NOTE — L&D Delivery Note (Addendum)
 Delivery Note Labor onset: 08/25/2023 Labor Onset Time: 1530 Complete dilation at 9:32 PM Onset of pushing at 2224 FHR second stage Cat 1 and 2 Analgesia/Anesthesia intrapartum: epidural  Guided pushing with maternal urge. Delivery of a viable female at 2326. Fetal head delivered in ROA position.  Nuchal cord: none.  Infant placed on maternal abd, dried, and tactile stim. Infant to warmer approx 45 sec after birth. NICU team called for neonatal support.   Cord double clamped after 45 sec and cut by father, Katherine Fischer.  RN x2 present for birth.  Cord blood sample collected: Yes Arterial cord blood sample collected: Yes pH 7.09  Placenta delivered Schultz side, intact, with 3 VC.  Placenta to L&D Yes. Uterine tone firm, bleeding small, no clots  2nd degree laceration identified.  Anesthesia: epidural Repair: 2-0 Vicryl CT and 4-0 Vicryl SH  QBL (mL): 190 Complications: none APGAR: APGAR (1 MIN): 5  APGAR (5 MINS):  7 APGAR (10 MINS):  8 Mom to postpartum.  Baby to NICU. Baby boy "Katherine Fischer" Circumcision Yes  Katherine Schanz DNP, CNM 08/26/2023, 12:09 AM

## 2023-06-27 ENCOUNTER — Other Ambulatory Visit (HOSPITAL_COMMUNITY): Payer: Self-pay

## 2023-06-27 MED ORDER — ABRYSVO 120 MCG/0.5ML IM SOLR: 0.5000 mL | Freq: Once | INTRAMUSCULAR | 0 refills | Status: AC

## 2023-07-05 ENCOUNTER — Encounter: Payer: Self-pay | Admitting: Pediatrics

## 2023-07-05 ENCOUNTER — Ambulatory Visit (INDEPENDENT_AMBULATORY_CARE_PROVIDER_SITE_OTHER): Payer: Self-pay | Admitting: Pediatrics

## 2023-07-05 ENCOUNTER — Other Ambulatory Visit (HOSPITAL_COMMUNITY): Payer: Self-pay

## 2023-07-05 DIAGNOSIS — Z7681 Expectant parent(s) prebirth pediatrician visit: Secondary | ICD-10-CM

## 2023-07-05 NOTE — Progress Notes (Signed)
Prenatal counseling for impending newborn done Parent agrees to vaccine and office policies [phone]

## 2023-07-06 ENCOUNTER — Other Ambulatory Visit (HOSPITAL_COMMUNITY): Payer: Self-pay

## 2023-07-31 LAB — OB RESULTS CONSOLE GBS: GBS: NEGATIVE

## 2023-08-18 ENCOUNTER — Other Ambulatory Visit: Payer: Self-pay | Admitting: Obstetrics and Gynecology

## 2023-08-22 ENCOUNTER — Encounter (HOSPITAL_COMMUNITY): Payer: Self-pay | Admitting: *Deleted

## 2023-08-22 ENCOUNTER — Encounter (HOSPITAL_COMMUNITY): Payer: Self-pay

## 2023-08-22 ENCOUNTER — Telehealth (HOSPITAL_COMMUNITY): Payer: Self-pay | Admitting: *Deleted

## 2023-08-22 NOTE — Telephone Encounter (Signed)
 Preadmission screen

## 2023-08-23 ENCOUNTER — Encounter (HOSPITAL_COMMUNITY): Payer: Self-pay | Admitting: *Deleted

## 2023-08-23 ENCOUNTER — Telehealth (HOSPITAL_COMMUNITY): Payer: Self-pay | Admitting: *Deleted

## 2023-08-23 NOTE — Telephone Encounter (Signed)
 Preadmission screen

## 2023-08-25 ENCOUNTER — Inpatient Hospital Stay (HOSPITAL_COMMUNITY): Admitting: Anesthesiology

## 2023-08-25 ENCOUNTER — Inpatient Hospital Stay (HOSPITAL_COMMUNITY)
Admission: AD | Admit: 2023-08-25 | Discharge: 2023-08-27 | DRG: 806 | Disposition: A | Discharge status: Home / Self Care | Attending: Obstetrics and Gynecology | Admitting: Obstetrics and Gynecology

## 2023-08-25 ENCOUNTER — Other Ambulatory Visit: Payer: Self-pay

## 2023-08-25 ENCOUNTER — Encounter (HOSPITAL_COMMUNITY): Payer: Self-pay | Admitting: Obstetrics and Gynecology

## 2023-08-25 DIAGNOSIS — O368130 Decreased fetal movements, third trimester, not applicable or unspecified: Principal | ICD-10-CM | POA: Diagnosis present

## 2023-08-25 DIAGNOSIS — O99214 Obesity complicating childbirth: Secondary | ICD-10-CM | POA: Diagnosis present

## 2023-08-25 DIAGNOSIS — E66813 Obesity, class 3: Secondary | ICD-10-CM | POA: Diagnosis present

## 2023-08-25 DIAGNOSIS — Z3A40 40 weeks gestation of pregnancy: Secondary | ICD-10-CM

## 2023-08-25 DIAGNOSIS — O864 Pyrexia of unknown origin following delivery: Secondary | ICD-10-CM | POA: Diagnosis not present

## 2023-08-25 DIAGNOSIS — Z3490 Encounter for supervision of normal pregnancy, unspecified, unspecified trimester: Secondary | ICD-10-CM | POA: Diagnosis present

## 2023-08-25 LAB — CBC
HCT: 32.5 % — ABNORMAL LOW (ref 36.0–46.0)
Hemoglobin: 10.6 g/dL — ABNORMAL LOW (ref 12.0–15.0)
MCH: 27.5 pg (ref 26.0–34.0)
MCHC: 32.6 g/dL (ref 30.0–36.0)
MCV: 84.4 fL (ref 80.0–100.0)
Platelets: 200 10*3/uL (ref 150–400)
RBC: 3.85 MIL/uL — ABNORMAL LOW (ref 3.87–5.11)
RDW: 14.4 % (ref 11.5–15.5)
WBC: 6.8 10*3/uL (ref 4.0–10.5)
nRBC: 0 % (ref 0.0–0.2)

## 2023-08-25 LAB — TYPE AND SCREEN
ABO/RH(D): AB POS
Antibody Screen: NEGATIVE

## 2023-08-25 LAB — POCT FERN TEST: POCT Fern Test: NEGATIVE

## 2023-08-25 MED ORDER — FENTANYL CITRATE (PF) 100 MCG/2ML IJ SOLN
50.0000 ug | INTRAMUSCULAR | Status: DC | PRN
  Administered 2023-08-25: 100 ug via INTRAVENOUS
  Administered 2023-08-25: 50 ug via INTRAVENOUS
  Administered 2023-08-25: 100 ug via INTRAVENOUS
  Filled 2023-08-25 (×3): qty 2

## 2023-08-25 MED ORDER — LACTATED RINGERS IV SOLN: 500.0000 mL | Freq: Once | INTRAVENOUS | Status: DC

## 2023-08-25 MED ORDER — LIDOCAINE HCL (PF) 1 % IJ SOLN
INTRAMUSCULAR | Status: DC | PRN
  Administered 2023-08-25 (×2): 4 mL via EPIDURAL

## 2023-08-25 MED ORDER — EPHEDRINE 5 MG/ML INJ: 10.0000 mg | INTRAVENOUS | Status: DC | PRN

## 2023-08-25 MED ORDER — DIPHENHYDRAMINE HCL 50 MG/ML IJ SOLN: 12.5000 mg | INTRAMUSCULAR | Status: DC | PRN

## 2023-08-25 MED ORDER — ACETAMINOPHEN 500 MG PO TABS
1000.0000 mg | ORAL_TABLET | Freq: Four times a day (QID) | ORAL | Status: DC | PRN
  Administered 2023-08-26: 1000 mg via ORAL
  Filled 2023-08-25: qty 2

## 2023-08-25 MED ORDER — ONDANSETRON HCL 4 MG/2ML IJ SOLN
4.0000 mg | Freq: Four times a day (QID) | INTRAMUSCULAR | Status: DC | PRN
  Administered 2023-08-25: 4 mg via INTRAVENOUS
  Filled 2023-08-25: qty 2

## 2023-08-25 MED ORDER — OXYTOCIN-SODIUM CHLORIDE 30-0.9 UT/500ML-% IV SOLN: 2.5000 [IU]/h | INTRAVENOUS | Status: DC

## 2023-08-25 MED ORDER — FENTANYL-BUPIVACAINE-NACL 0.5-0.125-0.9 MG/250ML-% EP SOLN
12.0000 mL/h | EPIDURAL | Status: DC | PRN
  Administered 2023-08-25: 12 mL/h via EPIDURAL
  Filled 2023-08-25: qty 250

## 2023-08-25 MED ORDER — OXYCODONE-ACETAMINOPHEN 5-325 MG PO TABS: 2.0000 | ORAL_TABLET | ORAL | Status: DC | PRN

## 2023-08-25 MED ORDER — OXYTOCIN-SODIUM CHLORIDE 30-0.9 UT/500ML-% IV SOLN
1.0000 m[IU]/min | INTRAVENOUS | Status: DC
  Administered 2023-08-25: 2 m[IU]/min via INTRAVENOUS
  Filled 2023-08-25: qty 500

## 2023-08-25 MED ORDER — ACETAMINOPHEN 325 MG PO TABS: 650.0000 mg | ORAL_TABLET | ORAL | Status: DC | PRN

## 2023-08-25 MED ORDER — LIDOCAINE HCL (PF) 1 % IJ SOLN: 30.0000 mL | INTRAMUSCULAR | Status: DC | PRN

## 2023-08-25 MED ORDER — SOD CITRATE-CITRIC ACID 500-334 MG/5ML PO SOLN: 30.0000 mL | ORAL | Status: DC | PRN

## 2023-08-25 MED ORDER — PHENYLEPHRINE 80 MCG/ML (10ML) SYRINGE FOR IV PUSH (FOR BLOOD PRESSURE SUPPORT): 80.0000 ug | PREFILLED_SYRINGE | INTRAVENOUS | Status: DC | PRN

## 2023-08-25 MED ORDER — OXYTOCIN BOLUS FROM INFUSION
333.0000 mL | Freq: Once | INTRAVENOUS | Status: AC
  Administered 2023-08-25: 333 mL via INTRAVENOUS

## 2023-08-25 MED ORDER — TERBUTALINE SULFATE 1 MG/ML IJ SOLN: 0.2500 mg | Freq: Once | INTRAMUSCULAR | Status: DC | PRN

## 2023-08-25 MED ORDER — LACTATED RINGERS IV SOLN: 500.0000 mL | INTRAVENOUS | Status: DC | PRN

## 2023-08-25 MED ORDER — LACTATED RINGERS IV SOLN: INTRAVENOUS | Status: DC

## 2023-08-25 MED ORDER — OXYCODONE-ACETAMINOPHEN 5-325 MG PO TABS: 1.0000 | ORAL_TABLET | ORAL | Status: DC | PRN

## 2023-08-25 NOTE — Anesthesia Procedure Notes (Signed)
 Epidural Patient location during procedure: OB Start time: 08/25/2023 6:24 PM End time: 08/25/2023 6:29 PM  Staffing Anesthesiologist: Linton Rump, MD Performed: anesthesiologist   Preanesthetic Checklist Completed: patient identified, IV checked, site marked, risks and benefits discussed, surgical consent, monitors and equipment checked, pre-op evaluation and timeout performed  Epidural Patient position: sitting Prep: DuraPrep and site prepped and draped Patient monitoring: continuous pulse ox and blood pressure Approach: midline Location: L3-L4 Injection technique: LOR saline  Needle:  Needle type: Tuohy  Needle gauge: 17 G Needle length: 9 cm and 9 Catheter type: closed end flexible Catheter size: 19 Gauge Catheter at skin depth: 14 cm Test dose: negative  Assessment Events: blood not aspirated, no cerebrospinal fluid, injection not painful, no injection resistance, no paresthesia and negative IV test  Additional Notes The patient has requested an epidural for labor pain management. Risks and benefits including, but not limited to, infection, bleeding, local anesthetic toxicity, headache, hypotension, back pain, block failure, etc. were discussed with the patient. The patient expressed understanding and consented to the procedure. I confirmed that the patient has no bleeding disorders and is not taking blood thinners. I confirmed the patient's last platelet count with the nurse. A time-out was performed immediately prior to the procedure. Please see nursing documentation for vital signs. Sterile technique was used throughout the whole procedure. Once LOR achieved, the epidural catheter threaded easily without resistance. Aspiration of the catheter was negative for blood and CSF. The epidural was dosed slowly and an infusion was started.  1 attempt(s)Reason for block:procedure for pain

## 2023-08-25 NOTE — H&P (Signed)
 OB ADMISSION/ HISTORY & PHYSICAL:  Admission Date: 08/25/2023 10:11 AM  Admit Diagnosis: Decreased fetal movement at term  Katherine Fischer is a 33 y.o. female G1P0 [redacted]w[redacted]d presenting for suspected loss of fluid. Pt was found to be intact and complained of decreased fetal movement x2 days. Denies vaginal bleeding.   History of current pregnancy: G1P0   Patient entered care with [facility] at 14+1 wks.   EDC 08/24/23 by 13 wk U/S. Hx of irregular periods   Anatomy scan:  complete w/ posterior placenta.   Antenatal testing: for BMI started at 37 weeks Last evaluation: 37+5 BPP 8/8, vertex/fundal placenta/ AFI 10 wks   Significant prenatal events:  Patient Active Problem List   Diagnosis Date Noted   Encounter for induction of labor 08/25/2023   Decreased fetal movement affecting management of pregnancy in third trimester 08/25/2023    Prenatal Labs: ABO, Rh: --/--/AB POS (04/04 1114) Antibody: NEG (04/04 1114) Rubella: Immune (10/04 0000)  RPR: Nonreactive (10/04 0000)  HBsAg: Negative (10/04 0000)  HIV: Non-reactive (10/04 0000)  GTT: normal GBS: Negative/-- (03/10 0000)  GC/CHL: neg/neg Genetics: AFP negative, low-risk female, Horizon negative Vaccines: Tdap: rec'vd in 3rd trimester Influenza: declined   OB History  Gravida Para Term Preterm AB Living  1       SAB IAB Ectopic Multiple Live Births          # Outcome Date GA Lbr Len/2nd Weight Sex Type Anes PTL Lv  1 Current             Medical / Surgical History: Past medical history: History reviewed. No pertinent past medical history.  Past surgical history: History reviewed. No pertinent surgical history. Family History: History reviewed. No pertinent family history.  Social History:  reports that she has never smoked. She has never used smokeless tobacco. She reports that she does not drink alcohol and does not use drugs.  Allergies: Patient has no known allergies.   Current Medications at time of admission:  Prior to  Admission medications   Medication Sig Start Date End Date Taking? Authorizing Provider  acetaminophen (TYLENOL) 325 MG tablet Take 650 mg by mouth every 6 (six) hours as needed for pain.    [provider]  promethazine (PHENERGAN) 25 MG tablet Take 1 tablet (25 mg total) by mouth every 6 (six) hours as needed for nausea. 05/24/12 05/29/13  Gilda Crease, MD  sodium chloride (OCEAN) 0.65 % SOLN nasal spray Place 1 spray into the nose as needed for congestion. 02/21/13 05/29/13  Cathlyn Parsons, NP    Review of Systems: Constitutional: Negative   HENT: Negative   Eyes: Negative   Respiratory: Negative   Cardiovascular: Negative   Gastrointestinal: Negative  Genitourinary: neg for bloody show, neg for LOF   Musculoskeletal: Negative   Skin: Negative   Neurological: Negative   Endo/Heme/Allergies: Negative   Psychiatric/Behavioral: Negative    Physical Exam: VS: Blood pressure 131/77, pulse 81, temperature 99.1 F (37.3 C), temperature source Oral, resp. rate 16, height 5\' 6"  (1.676 m), weight 114.8 kg, last menstrual period 11/20/2022, SpO2 100%. AAO x3, no signs of distress Cardiovascular: RRR Respiratory: Unlabored GU/GI: Abdomen gravid, non-tender, non-distended, active FM, vertex Extremities: trace edema, negative for pain, tenderness, and cords  Cervical exam:Dilation: 2 Effacement (%): 50 Station: Ballotable Exam by:: Lizbeth Bark, CNM FHR: baseline rate 140 / variability moderate / accelerations present / absent decelerations TOCO: 2-4   Prenatal Transfer Tool  Maternal Diabetes: No Genetic Screening:  Normal Maternal Ultrasounds/Referrals: Normal Fetal Ultrasounds or other Referrals:  None Maternal Substance Abuse:  No Significant Maternal Medications:  None Significant Maternal Lab Results: Group B Strep negative Number of Prenatal Visits:greater than 3 verified prenatal visits Maternal Vaccinations:TDap Other Comments:  None    Assessment: 33 y.o.  G1P0 [redacted]w[redacted]d  IOL for decreased fetal movement at term FHR category 1 GBS neg Pain management plan: epidural   Plan:  Admit to L&D Routine admission orders Epidural PRN Cook balloon placed w/ 80 mL in uterine balloon and 60 mL in vaginal balloon Pitocin to a max of 6 mU/min while balloon is in place and increase per protocol once expelled Dr Su Hilt notified of admission and plan of care  Roma Schanz DNP, CNM 08/25/2023 2:34 PM

## 2023-08-25 NOTE — MAU Note (Signed)
.  Katherine Fischer is a 33 y.o. at [redacted]w[redacted]d here in MAU reporting: she is unsure if her water has broken or if it is just her mucus plug. She reports episode of a gush on Wednesday of clear thick mucus followed by a gush of green, thick mucus on Thursday. Today she reports small amount of pink, clear discharge when wiping - denies pad use today. She denies watery LOF or recent IC. She also reports 1 ctx per hour beginning around Wednesday. She reports DFM since Wednesday.   She also reports palpitations since yesterday - reports it is worse when she lays down. This is accompanied by chest congestion that she has had her whole pregnancy.   Onset of complaint: Wednesday Pain score: 2/10 Vitals:   08/25/23 1035  BP: 127/77  Pulse: 81  Resp: 16  Temp: 98 F (36.7 C)  SpO2: 99%     FHT: 140  Lab orders placed from triage: Crist Fat slide

## 2023-08-25 NOTE — Progress Notes (Signed)
 Subjective:    Pain is well-managed via epidural  Objective:    VS: BP 114/75   Pulse (!) 130   Temp 98.9 F (37.2 C) (Axillary)   Resp 16   Ht 5\' 6"  (1.676 m)   Wt 114.8 kg   LMP 11/20/2022 (Exact Date)   SpO2 100%   BMI 40.85 kg/m  FHR : baseline 140 / variability moderate / accelerations present / variable decelerations Toco: contractions every 2 minutes  Membranes: SROM, moderate meconium Dilation: 6 Effacement (%): 100 Station: 0 Presentation: Vertex Exam by:: Cherre Blanc, RN Pitocin @ 2 mU/min then turned off  Assessment/Plan:   33 y.o. G1P0 [redacted]w[redacted]d IOL for decreased FM x2 days at term  Labor: Progressing normally Fetal Wellbeing:  Category I and Category II Pain Control:  Epidural I/D:   GBS neg Anticipated MOD:  NSVD  Roma Schanz DNP, CNM 08/25/2023 9:31 PM

## 2023-08-25 NOTE — MAU Provider Note (Addendum)
 Event Date/Time   First Provider Initiated Contact with Patient 08/25/23 1052      S Ms. Desma JALYNN WADDELL is a 33 y.o. G1P0 patient who presents to MAU today with complaint of decreased FM's since Wednesday at 470.1 GA . She also c/o loss of mucus plug and some LOF although fern is negative.    Review of Systems  All other systems reviewed and are negative.  Unless otherwise listed in HPI O BP 139/83   Pulse 99   Temp 98 F (36.7 C) (Oral)   Resp 16   Ht 5\' 6"  (1.676 m)   Wt 114.8 kg   LMP 11/20/2022 (Exact Date)   SpO2 100%   BMI 40.85 kg/m  Physical Exam Constitutional:      General: She is not in acute distress.    Appearance: Normal appearance. She is obese. She is not ill-appearing.  HENT:     Head: Normocephalic.     Nose: Nose normal.  Cardiovascular:     Rate and Rhythm: Normal rate and regular rhythm.  Pulmonary:     Effort: Pulmonary effort is normal.     Breath sounds: Normal breath sounds.  Abdominal:     Palpations: Abdomen is soft.  Musculoskeletal:        General: Normal range of motion.     Cervical back: Normal range of motion.  Skin:    General: Skin is warm.  Neurological:     Mental Status: She is oriented to person, place, and time.  Psychiatric:        Mood and Affect: Mood normal.        Behavior: Behavior normal.     FHR at 1124 Cat 1, reactive  Baseline: 140 Variability: moderate Accels: present Decels: absent Toco: irregular   SVE: 1/Long  Pt informed that the ultrasound is considered a limited OB ultrasound and is not intended to be a complete ultrasound exam.  Patient also informed that the ultrasound is not being completed with the intent of assessing for fetal or placental anomalies or any pelvic abnormalities.  Explained that the purpose of today's ultrasound is to assess for  presentation.  Patient acknowledges the purpose of the exam and the limitations of the study.   VTX confirmed  ASSESSMENT Medical screening exam  complete Admit to LDR for IOL at 40.1 with DFM @ [redacted]w[redacted]d D/W Dr Robert's (OB Attending)   PLAN Admission Orders to be placed by Dr Robert's ([facility]) On hold for RN to Release  Also spoke with Saddie Benders APP at [facility], Wilmer Floor, NP 08/25/2023 11:08 AM

## 2023-08-25 NOTE — Anesthesia Preprocedure Evaluation (Signed)
 Anesthesia Evaluation  Patient identified by MRN, date of birth, ID band Patient awake    Reviewed: Allergy & Precautions, NPO status , Patient's Chart, lab work & pertinent test results  Airway Mallampati: III  TM Distance: >3 FB Neck ROM: Full    Dental   Pulmonary neg pulmonary ROS   Pulmonary exam normal breath sounds clear to auscultation       Cardiovascular negative cardio ROS  Rhythm:Regular Rate:Normal     Neuro/Psych negative neurological ROS     GI/Hepatic negative GI ROS, Neg liver ROS,,,  Endo/Other  neg diabetes  Class 3 obesity  Renal/GU negative Renal ROS     Musculoskeletal   Abdominal  (+) + obese  Peds  Hematology negative hematology ROS (+) Lab Results      Component                Value               Date                      WBC                      6.8                 08/25/2023                HGB                      10.6 (L)            08/25/2023                HCT                      32.5 (L)            08/25/2023                MCV                      84.4                08/25/2023                PLT                      200                 08/25/2023              Anesthesia Other Findings   Reproductive/Obstetrics (+) Pregnancy                              Anesthesia Physical Anesthesia Plan  ASA: 3  Anesthesia Plan: Epidural   Post-op Pain Management:    Induction:   PONV Risk Score and Plan:   Airway Management Planned: Natural Airway  Additional Equipment:   Intra-op Plan:   Post-operative Plan:   Informed Consent: I have reviewed the patients History and Physical, chart, labs and discussed the procedure including the risks, benefits and alternatives for the proposed anesthesia with the patient or authorized representative who has indicated his/her understanding and acceptance.       Plan Discussed with: Anesthesiologist  Anesthesia  Plan Comments: (I have discussed risks of neuraxial anesthesia including  but not limited to infection, bleeding, nerve injury, back pain, headache, seizures, and failure of block. Patient denies bleeding disorders and is not currently anticoagulated. Labs have been reviewed. Risks and benefits discussed. All patient's questions answered.  )         Anesthesia Quick Evaluation

## 2023-08-25 NOTE — Plan of Care (Signed)

## 2023-08-26 ENCOUNTER — Encounter (HOSPITAL_COMMUNITY): Payer: Self-pay | Admitting: Obstetrics and Gynecology

## 2023-08-26 LAB — CBC
HCT: 28.2 % — ABNORMAL LOW (ref 36.0–46.0)
Hemoglobin: 9.6 g/dL — ABNORMAL LOW (ref 12.0–15.0)
MCH: 28 pg (ref 26.0–34.0)
MCHC: 34 g/dL (ref 30.0–36.0)
MCV: 82.2 fL (ref 80.0–100.0)
Platelets: 184 10*3/uL (ref 150–400)
RBC: 3.43 MIL/uL — ABNORMAL LOW (ref 3.87–5.11)
RDW: 14.2 % (ref 11.5–15.5)
WBC: 19.3 10*3/uL — ABNORMAL HIGH (ref 4.0–10.5)
nRBC: 0 % (ref 0.0–0.2)

## 2023-08-26 LAB — RPR: RPR Ser Ql: NONREACTIVE

## 2023-08-26 MED ORDER — DIBUCAINE (PERIANAL) 1 % EX OINT: 1.0000 | TOPICAL_OINTMENT | CUTANEOUS | Status: DC | PRN

## 2023-08-26 MED ORDER — SODIUM CHLORIDE 0.9 % IV SOLN
3.0000 g | Freq: Four times a day (QID) | INTRAVENOUS | Status: AC
  Administered 2023-08-26 (×4): 3 g via INTRAVENOUS
  Filled 2023-08-26 (×4): qty 8

## 2023-08-26 MED ORDER — SENNOSIDES-DOCUSATE SODIUM 8.6-50 MG PO TABS
2.0000 | ORAL_TABLET | ORAL | Status: DC
  Administered 2023-08-26 – 2023-08-27 (×2): 2 via ORAL
  Filled 2023-08-26 (×3): qty 2

## 2023-08-26 MED ORDER — ACETAMINOPHEN 325 MG PO TABS: 650.0000 mg | ORAL_TABLET | ORAL | Status: DC | PRN

## 2023-08-26 MED ORDER — WITCH HAZEL-GLYCERIN EX PADS: 1.0000 | MEDICATED_PAD | CUTANEOUS | Status: DC | PRN

## 2023-08-26 MED ORDER — ONDANSETRON HCL 4 MG/2ML IJ SOLN: 4.0000 mg | INTRAMUSCULAR | Status: DC | PRN

## 2023-08-26 MED ORDER — ONDANSETRON HCL 4 MG PO TABS: 4.0000 mg | ORAL_TABLET | ORAL | Status: DC | PRN

## 2023-08-26 MED ORDER — DIPHENHYDRAMINE HCL 25 MG PO CAPS: 25.0000 mg | ORAL_CAPSULE | Freq: Four times a day (QID) | ORAL | Status: DC | PRN

## 2023-08-26 MED ORDER — SIMETHICONE 80 MG PO CHEW: 80.0000 mg | CHEWABLE_TABLET | ORAL | Status: DC | PRN

## 2023-08-26 MED ORDER — BENZOCAINE-MENTHOL 20-0.5 % EX AERO
1.0000 | INHALATION_SPRAY | CUTANEOUS | Status: DC | PRN
  Administered 2023-08-26: 1 via TOPICAL
  Filled 2023-08-26: qty 56

## 2023-08-26 MED ORDER — COCONUT OIL OIL
1.0000 | TOPICAL_OIL | Status: DC | PRN
  Administered 2023-08-26: 1 via TOPICAL

## 2023-08-26 MED ORDER — PRENATAL MULTIVITAMIN CH
1.0000 | ORAL_TABLET | Freq: Every day | ORAL | Status: DC
  Administered 2023-08-26 – 2023-08-27 (×2): 1 via ORAL
  Filled 2023-08-26 (×2): qty 1

## 2023-08-26 MED ORDER — IBUPROFEN 600 MG PO TABS
600.0000 mg | ORAL_TABLET | Freq: Four times a day (QID) | ORAL | Status: DC
  Administered 2023-08-26 – 2023-08-27 (×5): 600 mg via ORAL
  Filled 2023-08-26 (×6): qty 1

## 2023-08-26 MED ORDER — TETANUS-DIPHTH-ACELL PERTUSSIS 5-2.5-18.5 LF-MCG/0.5 IM SUSY: 0.5000 mL | PREFILLED_SYRINGE | Freq: Once | INTRAMUSCULAR | Status: DC

## 2023-08-26 NOTE — Progress Notes (Signed)
 PPD# 1 SVD w/ 2nd degree laceration Information for the patient's newborn:  Katherine Fischer, Katherine Fischer [[id number]]  female  Baby Name Maze Circumcision yes   S:   Reports feeling good, states son is doing better  Tolerating PO fluid and solids No nausea or vomiting Bleeding is light Pain controlled with acetaminophen and ibuprofen (OTC) Up ad lib / ambulatory / voiding w/o difficulty and has been to the NICU  O:   VS: BP (!) 121/56 (BP Location: Left Arm)   Pulse (!) 105   Temp 98.9 F (37.2 C) (Oral) Comment: Pt states she was just eating ice.  Resp 20   Ht 5\' 6"  (1.676 m)   Wt 114.8 kg   LMP 11/20/2022 (Exact Date)   SpO2 98%   Breastfeeding Unknown   BMI 40.85 kg/m   LABS:  Recent Labs    08/25/23 1236 08/26/23 0436  WBC 6.8 19.3*  HGB 10.6* 9.6*  PLT 200 184   Blood type: --/--/AB POS (04/04 1114) Rubella: Immune (10/04 0000)                      I&O: Intake/Output      04/04 0701 04/05 0700 04/05 0701 04/06 0700   I.V. (mL/kg) 1467.1 (12.8)    Other 66.2    IV Piggyback 100    Total Intake(mL/kg) 1633.3 (14.2)    Urine (mL/kg/hr) 1050    Blood 150    Total Output 1200    Net +433.3           Physical Exam: Alert and oriented X3 Lungs: Clear and unlabored Heart: regular rate and rhythm / no mumurs Abdomen: soft, non-tender, non-distended  Fundus: firm, non-tender, U-2 Perineum: healing Lochia: appropriate Extremities: trace edema, negative for calf pain, tenderness, or cords    A:  PPD # 1  Normal exam  P:  Routine postpartum orders Lactation support PRN Anticipate D/C on PP day 2 Contraception: unsire Plan reviewed w/ Dr. Erik Obey, DNP, CNM 08/26/2023, 7:04 AM

## 2023-08-26 NOTE — Anesthesia Postprocedure Evaluation (Signed)
 Anesthesia Post Note  Patient: [place] M Coakley  Procedure(s) Performed: AN AD HOC LABOR EPIDURAL     Patient location during evaluation: OB High Risk Anesthesia Type: Epidural Level of consciousness: awake, oriented and awake and alert Pain management: pain level controlled Vital Signs Assessment: post-procedure vital signs reviewed and stable Respiratory status: spontaneous breathing, respiratory function stable and nonlabored ventilation Cardiovascular status: stable Postop Assessment: no headache, adequate PO intake, able to ambulate, patient able to bend at knees and no apparent nausea or vomiting Anesthetic complications: no   No notable events documented.  Last Vitals:  Vitals:   08/26/23 0334 08/26/23 0714  BP: (!) 121/56 106/60  Pulse: (!) 105 97  Resp: 20 18  Temp: 37.2 C 36.9 C  SpO2: 98% 100%    Last Pain:  Vitals:   08/26/23 0749  TempSrc:   PainSc: 4    Pain Goal:                   Nathaniel Yaden

## 2023-08-26 NOTE — Plan of Care (Signed)

## 2023-08-26 NOTE — Lactation Note (Addendum)
 This note was copied from a baby's chart.  NICU Lactation Consultation Note  Patient Name: Katherine Fischer ZOXWR'[id number] Date: 08/26/2023 Age:31 hours  Reason for consult: Initial assessment; Primapara; 1st time breastfeeding; NICU baby; Term  SUBJECTIVE  LC in to visit with Katherine Fischer of term baby Katherine "Katherine Fischer" admitted to NICU for respiratory support.  Baby is 21% CPAP and currently NPO.    Fischer's goal is to breastfeed "Katherine Fischer"  LC noted that Fischer had initiated pumping with RN assistance at 9 hrs post delivery.  Fischer expressed 3 ml of colostrum which LC capped and placed in refrigerator.  Parents to request breast milk labels from baby's RN.  Pump parts noticed to be soaking in cold water in the bathroom.  LC provided a wash and a drying basin for pump parts and demonstrated importance of disassembling pump parts to wash, rinse and air dry in separate basin.  CDC guidelines provided to parents.  LC provided Fischer with a hands free pumping band and sized her with 24 mm flanges on right breast and 21 mm flanges on left side.  Fischer using coconut oil to lubricate tissue during pumping. Reviewed hand expression and encouraged Fischer to add this to pumping consistently.   OBJECTIVE Infant data: No data recorded O2 Device: CPAP FiO2 (%): 21 %  Infant feeding assessment No data recorded  Maternal data: G1P1001 Vaginal, Spontaneous Has patient been taught Hand Expression?: Yes Hand Expression Comments: Reviewed this with Fischer as she said she learned in a prenatal breastfeeding class and has been hand expressing Significant Breast History:: ++ breast changes Current breast feeding challenges:: infant separation Does the patient have breastfeeding experience prior to this delivery?: No Pumping frequency: Initiated pumping at 9 hrs post delivery, encouraged to pump every 2-3 hrs between naps Pumped volume: 3 mL Flange Size: 21; 24 (right nipple 24 flange and left 21 flange) Hands-free pumping top sizes: Large  Wallace Cullens) Risk factor for low/delayed milk supply:: Infant admitted to NICU for respiratory support  Pump: DEBP, Personal (DEBP obtained through insurance)  ASSESSMENT Infant:  Feeding Status: NPO  Maternal: Milk volume: Normal  INTERVENTIONS/PLAN Interventions: Interventions: Breast feeding basics reviewed; Skin to skin; Breast massage; Hand express; DEBP; Coconut oil; LC Services brochure; NICU Pumping Log; CDC Guidelines for Breast Pump Cleaning; CDC milk storage guidelines Tools: Pump; Flanges; Coconut oil; Hands-free pumping top Pump Education: Milk Storage; Setup, frequency, and cleaning  Plan: 1- STS with baby when able to 2- Massage breasts and hand express often 3- Pump both breasts on initiation setting every 2- 3hrs between naps, goal is 6-8 times per 24 hrs.  Consult Status: NICU follow-up NICU Follow-up type: New admission follow up   [name] 08/26/2023, 10:00 AM

## 2023-08-26 NOTE — Lactation Note (Signed)
 This note was copied from a baby's chart.  NICU Lactation Consultation Note  Patient Name: Katherine Fischer ZOXWR'U Date: 08/26/2023 Age:31 hours  Reason for consult: Follow-up assessment  SUBJECTIVE  Baby currently on room air with PIV fluids and one gavage feed of 18 ml.  No void yet.  LC in to visit with P1 Mom to assist with baby's first feeding at the breast.  Baby placed STS in laid back prone position on left breast.  Hand expressed drop of colostrum and after a few attempts, baby able to attain a deep latch to the breast.  Baby sucked with deep jaw extensions and pauses.  Mom denied any discomfort with the latch.  When baby came off on his own, LC assisted with positioning baby in cross cradle on right breast.  Mom able to latch baby with minimal assistance from [facility].  Baby relaxed and sucking with nutritive suck pattern.  Encouraged Mom to offer the breast often with cues.  Mom encouraged to pump AFTER baby breastfeeds while baby remains on PIV and/or supplement.   OBJECTIVE Infant data: No data recorded O2 Device: (S) Room Air O2 Flow Rate (L/min): 4 L/min FiO2 (%): 21 %  Infant feeding assessment IDFTS - Readiness: 1   Maternal data: G1P1001 Vaginal, Spontaneous Has patient been taught Hand Expression?: Yes Hand Expression Comments: Reviewed this with Mom as she said she learned in a prenatal breastfeeding class and has been hand expressing Significant Breast History:: ++ breast changes Current breast feeding challenges:: infant separation Does the patient have breastfeeding experience prior to this delivery?: No Pumping frequency: Initiated pumping at 9 hrs post delivery, encouraged to pump every 2-3 hrs between naps Pumped volume: 3 mL Flange Size: 21; 24 (right nipple 24 flange and left 21 flange) Hands-free pumping top sizes: Large Wallace Cullens) Risk factor for low/delayed milk supply:: Infant admitted to NICU for respiratory support  Pump: DEBP, Personal (DEBP obtained  through insurance)  ASSESSMENT Infant: Latch: Grasps breast easily, tongue down, lips flanged, rhythmical sucking. Audible Swallowing: Spontaneous and intermittent Type of Nipple: Everted at rest and after stimulation Comfort (Breast/Nipple): Soft / non-tender Hold (Positioning): Assistance needed to correctly position infant at breast and maintain latch. (minimal assistance from [facility]) LATCH Score: 9  Feeding Status: Scheduled 9-12-3-6 Feeding method: Breast  Maternal: Milk volume: Normal  INTERVENTIONS/PLAN Interventions: Interventions: Breast feeding basics reviewed; Assisted with latch; Skin to skin; Breast massage; Hand express; Position options; Support pillows; Adjust position Tools: Pump; Flanges; Coconut oil; Hands-free pumping top Pump Education: Milk Storage; Setup, frequency, and cleaning  Plan: Consult Status: NICU follow-up NICU Follow-up type: Verify onset of copious milk; Verify absence of engorgement   Katherine Fischer 08/26/2023, 3:32 PM

## 2023-08-27 MED ORDER — IBUPROFEN 600 MG PO TABS: 600.0000 mg | ORAL_TABLET | Freq: Four times a day (QID) | ORAL | 0 refills | Status: AC

## 2023-08-27 NOTE — Discharge Summary (Addendum)
 Postpartum Discharge Summary  Date of Service: 08/27/23     Patient Name: Katherine Fischer DOB: April 03, 1991 MRN: [id number]  Date of admission: 08/25/2023 Delivery date:08/25/2023 Delivering provider: Rhea Pink B Date of discharge: 08/27/2023  Admitting diagnosis: Encounter for induction of labor [Z34.90] Decreased fetal movement Intrauterine pregnancy: [redacted]w[redacted]d     Secondary diagnosis:  Principal Problem:   Decreased fetal movement affecting management of pregnancy in third trimester Active Problems:   Encounter for induction of labor  Additional problems: BMI 40   Discharge diagnosis: Term Pregnancy Delivered                                              Post partum procedures: None Augmentation: Pitocin and IP Foley Complications: None  Hospital course: Induction of Labor With Vaginal Delivery   33 y.o. yo G1P1001 at [redacted]w[redacted]d was admitted to the hospital 08/25/2023 for induction of labor.  Indication for induction:  Decreased fetal movement .  Patient had an labor course complicated by nothing. Membrane Rupture Time/Date:  ,   Delivery Method:Vaginal, Spontaneous Operative Delivery:N/A Episiotomy: None Lacerations:  2nd degree Details of delivery can be found in separate delivery note.  Patient had a postpartum course complicated by nothing. She was given Unasyn x 24h due to fever postpartum. She was noted to have had occasional mild range blood pressures but largely normotensive. She was advised to call [facility] for BP check within 1 week. Patient is discharged home 08/27/23.  Newborn Data: Birth date:08/25/2023 Birth time:11:26 PM Gender:Female Living status:Living Apgars:5 ,7  Weight:3510 g  Circumcision Consent:  Routine circumcisions performed on newborns have been identified as voluntary, elective procedures by [employer] such as the [employer] [facility].  It is considered an elective procedure with no definitive medical indication and carries risks. Risks include  but are not limited to bleeding, infection, damage to penis with possible need for further surgery, poor cosmesis, and local anesthetic risks. Circumcision will only be performed if patient is deemed to have normal anatomy by his Pediatrician, meets adequate criteria for a newborn of similar gestational age after birth and is without infection or other medical issue contraindicating an elective procedure. Patient understands and agrees. Patient discussed with parents of infant.  Magnesium Sulfate received: No BMZ received: No Rhophylac:N/A MMR:N/A T-DaP:Given prenatally Flu: No RSV Vaccine received: No Transfusion:No Immunizations administered: Immunization History  Administered Date(s) Administered   PPD Test 10/25/2017    Physical exam  Vitals:   08/26/23 1637 08/26/23 2023 08/27/23 0348 08/27/23 0856  BP: 114/70 104/86 113/60 120/66  Pulse: 80 (!) 116 85 73  Resp: 18 18  18   Temp: 97.6 F (36.4 C) 98.7 F (37.1 C)  97.6 F (36.4 C)  TempSrc: Oral Oral  Oral  SpO2: 100% 100% 100% 100%  Weight:      Height:       General: alert, cooperative, and no distress Lochia: appropriate Uterine Fundus: firm Incision: N/A DVT Evaluation: No evidence of DVT seen on physical exam. No cords or calf tenderness. Labs: Lab Results  Component Value Date   WBC 19.3 (H) 08/26/2023   HGB 9.6 (L) 08/26/2023   HCT 28.2 (L) 08/26/2023   MCV 82.2 08/26/2023   PLT 184 08/26/2023      Latest Ref Rng & Units 05/08/2022    4:42 PM  CMP  Glucose 70 - 99  mg/dL 161   BUN 6 - 20 mg/dL 11   Creatinine 0.96 - 1.00 mg/dL 0.45   Sodium 409 - 811 mmol/L 140   Potassium 3.5 - 5.1 mmol/L 3.2   Chloride 98 - 111 mmol/L 103   CO2 22 - 32 mmol/L 24   Calcium 8.9 - 10.3 mg/dL 8.6   Total Protein 6.5 - 8.1 g/dL 7.5   Total Bilirubin 0.3 - 1.2 mg/dL 0.3   Alkaline Phos 38 - 126 U/L 59   AST 15 - 41 U/L 20   ALT 0 - 44 U/L 15    Edinburgh Score:    08/27/2023    9:00 AM  Edinburgh Postnatal  Depression Scale Screening Tool  I have been able to laugh and see the funny side of things. 0  I have looked forward with enjoyment to things. 0  I have blamed myself unnecessarily when things went wrong. 1  I have been anxious or worried for no good reason. 1  I have felt scared or panicky for no good reason. 0  Things have been getting on top of me. 1  I have been so unhappy that I have had difficulty sleeping. 1  I have felt sad or miserable. 0  I have been so unhappy that I have been crying. 0  The thought of harming myself has occurred to me. 0  Edinburgh Postnatal Depression Scale Total 4      After visit meds:  Allergies as of 08/27/2023   No Known Allergies      Medication List     TAKE these medications    acetaminophen 325 MG tablet Commonly known as: TYLENOL Take 650 mg by mouth every 6 (six) hours as needed for pain.   ibuprofen 600 MG tablet Commonly known as: ADVIL Take 1 tablet (600 mg total) by mouth every 6 (six) hours.         Discharge home in stable condition Infant Feeding: Breast Infant Disposition:home with mother Discharge instruction: per After Visit Summary and Postpartum booklet. Activity: Advance as tolerated. Pelvic rest for 6 weeks.  Diet: routine diet Anticipated Birth Control:  To be discussed at [facility] Postpartum Appointment:6 weeks Additional Postpartum F/U: BP check 1 week Future Appointments:No future appointments. Follow up Visit:  Follow-up Information     [facility] Follow up.   Specialty: Obstetrics and Gynecology Why: Please call [facility] to arrange a 1 week blood pressure check up postpartuma nd your 6 week postpartum visit. Contact information: [address] [place] [address] [phone]                     08/27/2023 Steva Ready, DO

## 2023-08-27 NOTE — Plan of Care (Signed)

## 2023-08-27 NOTE — Plan of Care (Signed)
 Problem: Education: Goal: Knowledge of General Education information will improve Description: Including pain rating scale, medication(s)/side effects and non-pharmacologic comfort measures 08/27/2023 1331 by Samuella Cota, RN Outcome: Completed/Met 08/27/2023 1035 by Samuella Cota, RN Outcome: Progressing   Problem: Health Behavior/Discharge Planning: Goal: Ability to manage health-related needs will improve 08/27/2023 1331 by Samuella Cota, RN Outcome: Completed/Met 08/27/2023 1035 by Samuella Cota, RN Outcome: Progressing   Problem: Clinical Measurements: Goal: Ability to maintain clinical measurements within normal limits will improve 08/27/2023 1331 by Samuella Cota, RN Outcome: Completed/Met 08/27/2023 1035 by Samuella Cota, RN Outcome: Progressing Goal: Will remain free from infection 08/27/2023 1331 by Samuella Cota, RN Outcome: Completed/Met 08/27/2023 1035 by Samuella Cota, RN Outcome: Progressing Goal: Diagnostic test results will improve 08/27/2023 1331 by Samuella Cota, RN Outcome: Completed/Met 08/27/2023 1035 by Samuella Cota, RN Outcome: Progressing Goal: Respiratory complications will improve 08/27/2023 1331 by Samuella Cota, RN Outcome: Completed/Met 08/27/2023 1035 by Samuella Cota, RN Outcome: Progressing Goal: Cardiovascular complication will be avoided 08/27/2023 1331 by Samuella Cota, RN Outcome: Completed/Met 08/27/2023 1035 by Samuella Cota, RN Outcome: Progressing   Problem: Activity: Goal: Risk for activity intolerance will decrease 08/27/2023 1331 by Samuella Cota, RN Outcome: Completed/Met 08/27/2023 1035 by Samuella Cota, RN Outcome: Progressing   Problem: Nutrition: Goal: Adequate nutrition will be maintained 08/27/2023 1331 by Samuella Cota, RN Outcome: Completed/Met 08/27/2023 1035 by Samuella Cota, RN Outcome: Progressing   Problem: Coping: Goal: Level of anxiety will decrease 08/27/2023 1331 by Samuella Cota, RN Outcome: Completed/Met 08/27/2023 1035 by Samuella Cota, RN Outcome: Progressing   Problem: Elimination: Goal: Will not experience complications related to bowel motility 08/27/2023 1331 by Samuella Cota, RN Outcome: Completed/Met 08/27/2023 1035 by Samuella Cota, RN Outcome: Progressing Goal: Will not experience complications related to urinary retention 08/27/2023 1331 by Samuella Cota, RN Outcome: Completed/Met 08/27/2023 1035 by Samuella Cota, RN Outcome: Progressing   Problem: Pain Managment: Goal: General experience of comfort will improve and/or be controlled 08/27/2023 1331 by Samuella Cota, RN Outcome: Completed/Met 08/27/2023 1035 by Samuella Cota, RN Outcome: Progressing   Problem: Safety: Goal: Ability to remain free from injury will improve 08/27/2023 1331 by Samuella Cota, RN Outcome: Completed/Met 08/27/2023 1035 by Samuella Cota, RN Outcome: Progressing   Problem: Skin Integrity: Goal: Risk for impaired skin integrity will decrease 08/27/2023 1331 by Samuella Cota, RN Outcome: Completed/Met 08/27/2023 1035 by Samuella Cota, RN Outcome: Progressing   Problem: Education: Goal: Knowledge of Childbirth will improve 08/27/2023 1331 by Samuella Cota, RN Outcome: Completed/Met 08/27/2023 1035 by Samuella Cota, RN Outcome: Progressing Goal: Ability to make informed decisions regarding treatment and plan of care will improve 08/27/2023 1331 by Samuella Cota, RN Outcome: Completed/Met 08/27/2023 1035 by Samuella Cota, RN Outcome: Progressing Goal: Ability to state and carry out methods to decrease the pain will improve 08/27/2023 1331 by Samuella Cota, RN Outcome: Completed/Met 08/27/2023 1035 by Samuella Cota, RN Outcome: Progressing Goal: Individualized Educational Video(s) 08/27/2023 1331 by Samuella Cota, RN Outcome: Completed/Met 08/27/2023 1035 by Samuella Cota, RN Outcome: Progressing   Problem: Coping: Goal: Ability to verbalize concerns and feelings about labor and delivery will improve 08/27/2023 1331 by Samuella Cota,  RN Outcome: Completed/Met 08/27/2023 1035 by Samuella Cota, RN Outcome: Progressing   Problem: Life Cycle: Goal: Ability to make normal progression  through stages of labor will improve 08/27/2023 1331 by Samuella Cota, RN Outcome: Completed/Met 08/27/2023 1035 by Samuella Cota, RN Outcome: Progressing Goal: Ability to effectively push during vaginal delivery will improve 08/27/2023 1331 by Samuella Cota, RN Outcome: Completed/Met 08/27/2023 1035 by Samuella Cota, RN Outcome: Progressing   Problem: Role Relationship: Goal: Will demonstrate positive interactions with the child 08/27/2023 1331 by Samuella Cota, RN Outcome: Completed/Met 08/27/2023 1035 by Samuella Cota, RN Outcome: Progressing   Problem: Safety: Goal: Risk of complications during labor and delivery will decrease 08/27/2023 1331 by Samuella Cota, RN Outcome: Completed/Met 08/27/2023 1035 by Samuella Cota, RN Outcome: Progressing   Problem: Pain Management: Goal: Relief or control of pain from uterine contractions will improve 08/27/2023 1331 by Samuella Cota, RN Outcome: Completed/Met 08/27/2023 1035 by Samuella Cota, RN Outcome: Progressing   Problem: Education: Goal: Knowledge of condition will improve 08/27/2023 1331 by Samuella Cota, RN Outcome: Completed/Met 08/27/2023 1035 by Samuella Cota, RN Outcome: Progressing Goal: Individualized Educational Video(s) 08/27/2023 1331 by Samuella Cota, RN Outcome: Completed/Met 08/27/2023 1035 by Samuella Cota, RN Outcome: Progressing Goal: Individualized Newborn Educational Video(s) 08/27/2023 1331 by Samuella Cota, RN Outcome: Completed/Met 08/27/2023 1035 by Samuella Cota, RN Outcome: Progressing   Problem: Activity: Goal: Will verbalize the importance of balancing activity with adequate rest periods 08/27/2023 1331 by Samuella Cota, RN Outcome: Completed/Met 08/27/2023 1035 by Samuella Cota, RN Outcome: Progressing Goal: Ability to tolerate increased activity will improve 08/27/2023  1331 by Samuella Cota, RN Outcome: Completed/Met 08/27/2023 1035 by Samuella Cota, RN Outcome: Progressing   Problem: Coping: Goal: Ability to identify and utilize available resources and services will improve 08/27/2023 1331 by Samuella Cota, RN Outcome: Completed/Met 08/27/2023 1035 by Samuella Cota, RN Outcome: Progressing   Problem: Life Cycle: Goal: Chance of risk for complications during the postpartum period will decrease 08/27/2023 1331 by Samuella Cota, RN Outcome: Completed/Met 08/27/2023 1035 by Samuella Cota, RN Outcome: Progressing   Problem: Role Relationship: Goal: Ability to demonstrate positive interaction with newborn will improve 08/27/2023 1331 by Samuella Cota, RN Outcome: Completed/Met 08/27/2023 1035 by Samuella Cota, RN Outcome: Progressing   Problem: Skin Integrity: Goal: Demonstration of wound healing without infection will improve 08/27/2023 1331 by Samuella Cota, RN Outcome: Completed/Met 08/27/2023 1035 by Samuella Cota, RN Outcome: Progressing

## 2023-08-27 NOTE — Lactation Note (Signed)
 This note was copied from a baby's chart.  NICU Lactation Consultation Note  Patient Name: Katherine Fischer [id number] Date: 08/27/2023 Age:31 hours  Reason for consult: Follow-up assessment; NICU baby; Primapara; 1st time breastfeeding; Term  SUBJECTIVE  LC in to visit with [name] who is being discharged from the NICU.  Mom states baby has been too sleepy to latch, but she plans to breastfeed when baby is showing feeding cues.  Encouraged Mom to do STS with baby and ask for help with latching prn.  If baby is supplemented with a bottle, encouraged Mom to continue to pump to support her milk supply  OBJECTIVE Infant data: No data recorded O2 Device: Room Air O2 Flow Rate (L/min): 4 L/min FiO2 (%): 21 %  Infant feeding assessment IDFTS - Readiness: 1 IDFTS - Quality: 1   Maternal data: G1P1001 Vaginal, Spontaneous Has patient been taught Hand Expression?: Yes Hand Expression Comments: Reviewed this with Mom as she said she learned in a prenatal breastfeeding class and has been hand expressing Significant Breast History:: ++ breast changes Current breast feeding challenges:: infant separation Does the patient have breastfeeding experience prior to this delivery?: No Pumping frequency: 8 times per 24 hrs Pumped volume: 5 mL Flange Size: 21; 24 Hands-free pumping top sizes: Large Wallace Cullens) Risk factor for low/delayed milk supply:: Infant admitted to NICU for respiratory support  Pump: DEBP, Personal (DEBP obtained through insurance)  ASSESSMENT Infant:  Feeding Status: Ad lib Feeding method: Bottle Nipple Type: Dr. Irving Burton Preemie  Maternal: Milk volume: Normal  INTERVENTIONS/PLAN Interventions: Interventions: Breast feeding basics reviewed; Skin to skin; Breast massage; Hand express; Hand pump; DEBP; Coconut oil Tools: Pump; Flanges; Hands-free pumping top; Coconut oil Pump Education: Setup, frequency, and cleaning; Milk Storage  Plan:  1- STS with  "Katherine Fischer" as much as possible 2- Offer the breast with feeding cues, goal is 8 times per 24 hrs, ask for help prn 3- if baby is supplemented by bottle, encouraged Mom to pump both breasts on initiation setting to support milk supply.  Consult Status: Follow-up NICU Follow-up type: Verify onset of copious milk; Verify absence of engorgement   Katherine Fischer 08/27/2023, 12:55 PM

## 2023-08-28 ENCOUNTER — Ambulatory Visit (HOSPITAL_COMMUNITY): Payer: Self-pay

## 2023-08-28 NOTE — Lactation Note (Signed)
 This note was copied from a baby's chart. Lactation Consultation Note  Patient Name: Katherine Fischer [id number] Date: 08/28/2023 Age:31 hrs Reason for consult: Follow-up assessment;Maternal discharge  P1, 40 wks, @ 56 hrs of life. Discussed differences between breast and bottle feeding- Encouraged starting with hand expression and using breast compression to keep infant working at breast. Discussed working on big mouth latching @ breast. Discussed ways to work on Paediatric nurse. Encouraged EBM or coconut oil for nipple health- applied post feeding.  Encouraged mom breast stimulation ties directly into milk production. Encouraged pumping or feeding ever 3 hrs. Discussed movement of milk regularly to prevent cascade of engorgement, plugged ducts, mastitis. Probiotic can also be useful. Discussed cluster feeding overnight/ early morning brings in our milk supply, shared expectations of milk coming in. Highlighted risk of engorgement. Discussed hand pump/express to soften breasts, motrin as anti-inflammatory, and ice packs for 10-20 minutes post feed/pumping if still over-full is the best treatments for inflamed/engorged breasts.   Feeding Mother's Current Feeding Choice: Breast Milk and Donor Milk Nipple Type: Dr. Irving Burton Preemie   Interventions Interventions: Breast feeding basics reviewed;Hand express;Breast compression;Expressed milk;Coconut oil;Hand pump;Education;LC Services brochure;CDC milk storage guidelines  Discharge Discharge Education: Engorgement and breast care Pump: Personal;DEBP;Manual (Per mom has phillips pump @ home- highlighted hoe pump kit parts make a hand pump- highlighted best for moving milk from engorged breast)  Consult Status Consult Status: Complete Date: 08/28/23 Follow-up type: In-patient    [facility] 08/28/2023, 10:02 AM

## 2023-08-31 ENCOUNTER — Inpatient Hospital Stay (HOSPITAL_COMMUNITY)
Admission: RE | Admit: 2023-08-31 | Payer: Medicaid Other | Source: Home / Self Care | Admitting: Obstetrics and Gynecology

## 2023-08-31 ENCOUNTER — Inpatient Hospital Stay (HOSPITAL_COMMUNITY)

## 2023-09-04 ENCOUNTER — Telehealth (HOSPITAL_COMMUNITY): Payer: Self-pay | Admitting: *Deleted

## 2023-09-04 NOTE — Telephone Encounter (Signed)
 Attempted hospital discharge follow-up call. Left message for patient to return RN call with any questions or concerns. Julien Odor, RN, 09/04/23, [phone]

## 2024-05-26 ENCOUNTER — Emergency Department (HOSPITAL_BASED_OUTPATIENT_CLINIC_OR_DEPARTMENT_OTHER): Admitting: Radiology

## 2024-05-26 ENCOUNTER — Encounter (HOSPITAL_BASED_OUTPATIENT_CLINIC_OR_DEPARTMENT_OTHER): Payer: Self-pay

## 2024-05-26 ENCOUNTER — Other Ambulatory Visit: Payer: Self-pay

## 2024-05-26 ENCOUNTER — Emergency Department (HOSPITAL_BASED_OUTPATIENT_CLINIC_OR_DEPARTMENT_OTHER)
Admission: EM | Admit: 2024-05-26 | Discharge: 2024-05-26 | Disposition: A | Discharge status: Home / Self Care | Source: Home / Self Care | Attending: Emergency Medicine | Admitting: Emergency Medicine

## 2024-05-26 DIAGNOSIS — R059 Cough, unspecified: Secondary | ICD-10-CM | POA: Insufficient documentation

## 2024-05-26 MED ORDER — BENZONATATE 100 MG PO CAPS: 100.0000 mg | ORAL_CAPSULE | Freq: Three times a day (TID) | ORAL | 0 refills | Status: AC

## 2024-05-26 NOTE — ED Provider Notes (Signed)
 " [place] [facility] AT [facility] Provider Note   CSN: [id number] Arrival date & time: 05/26/24  1904     Patient presents with: No chief complaint on file.   Katherine Fischer is a 34 y.o. female.  {Add pertinent medical, surgical, social history, OB history to HPI:7423} 34 year old female presenting with several days of a cough.  Patient brings in her 92-month-old son with similar symptoms, reports that her son was symptomatic first and she has been symptomatic for several days now with a cough as well, no fever at home.  Reports some sensation of ringing in the ears/ear fullness as well.  Denies chest pain or shortness of breath.        Prior to Admission medications  Medication Sig Start Date End Date Taking? Authorizing Provider  acetaminophen  (TYLENOL ) 325 MG tablet Take 650 mg by mouth every 6 (six) hours as needed for pain.    [provider]  ibuprofen  (ADVIL ) 600 MG tablet Take 1 tablet (600 mg total) by mouth every 6 (six) hours. 08/27/23   Storm Setter, DO  promethazine  (PHENERGAN ) 25 MG tablet Take 1 tablet (25 mg total) by mouth every 6 (six) hours as needed for nausea. 05/24/12 05/29/13  Haze Lonni PARAS, MD  sodium chloride  (OCEAN) 0.65 % SOLN nasal spray Place 1 spray into the nose as needed for congestion. 02/21/13 05/29/13  Richad Jon CHRISTELLA, NP    Allergies: Patient has no known allergies.    Review of Systems  Updated Vital Signs  Vitals:   05/26/24 1914  BP: (!) 124/90  Pulse: (!) 111  Resp: 16  Temp: (!) 97.3 F (36.3 C)  SpO2: 98%     Physical Exam Vitals and nursing note reviewed.  HENT:     Head: Normocephalic.  Eyes:     Extraocular Movements: Extraocular movements intact.  Cardiovascular:     Rate and Rhythm: Normal rate and regular rhythm.     Heart sounds: Normal heart sounds.  Pulmonary:     Effort: Pulmonary effort is normal. No respiratory distress.     Breath sounds: Normal breath sounds.   Musculoskeletal:     Cervical back: Normal range of motion.     Comments: Moves all extremities spontaneously without difficulty  Skin:    General: Skin is warm and dry.  Neurological:     Mental Status: She is alert and oriented to person, place, and time.     (all labs ordered are listed, but only abnormal results are displayed) Labs Reviewed - No data to display  EKG: None  Radiology: DG Chest 2 View Result Date: 05/26/2024 EXAM: 2 VIEW(S) XRAY OF THE CHEST 05/26/2024 07:52:07 PM COMPARISON: Comparison with 05/08/2022. CLINICAL HISTORY: cough FINDINGS: LUNGS AND PLEURA: Lungs are clear. No pleural effusion or pneumothorax. HEART AND MEDIASTINUM: Heart size and pulmonary vascularity are normal. Mediastinal contours appear intact. BONES AND SOFT TISSUES: No acute osseous abnormality. IMPRESSION: 1. No acute cardiopulmonary abnormality. Electronically signed by: Elsie Gravely MD 05/26/2024 07:56 PM EST RP Workstation: [id number]    {Document cardiac monitor, telemetry assessment procedure when appropriate:32947} Procedures   Medications Ordered in the ED - No data to display    {Click here for ABCD2, HEART and other calculators REFRESH Note before signing:1}                              Medical Decision Making This patient presents to the ED for  concern of ***, this involves an extensive number of treatment options, and is a complaint that carries with it a high risk of complications and morbidity.  The differential diagnosis includes ***   Co morbidities that complicate the patient evaluation  ***   Additional history obtained:  Additional history obtained from *** External records from outside source obtained and reviewed including ***   Lab Tests:  I Ordered, and personally interpreted labs.  The pertinent results include:  ***    Imaging Studies ordered:  I ordered imaging studies including ***  I independently visualized and interpreted imaging which showed  *** I agree with the radiologist interpretation   Cardiac Monitoring: / EKG:  The patient was maintained on a cardiac monitor.  I personally viewed and interpreted the cardiac monitored which showed an underlying rhythm of: ***   Consultations Obtained:  I requested consultation with the ***,  and discussed lab and imaging findings as well as pertinent plan - they recommend: ***   Problem List / ED Course / Critical interventions / Medication management  *** I ordered medication including ***  for ***  Reevaluation of the patient after these medicines showed that the patient {resolved/improved/worsened:23923::improved} I have reviewed the patients home medicines and have made adjustments as needed   Social Determinants of Health:  ***   Test / Admission - Considered:  ***    Amount and/or Complexity of Data Reviewed Radiology: ordered.  Risk Prescription drug management.   ***  {Document critical care time when appropriate  Document review of labs and clinical decision tools ie CHADS2VASC2, etc  Document your independent review of radiology images and any outside records  Document your discussion with family members, caretakers and with consultants  Document social determinants of health affecting pt's care  Document your decision making why or why not admission, treatments were needed:32947:::1}   Final diagnoses:  None    ED Discharge Orders     None        "

## 2024-05-26 NOTE — ED Triage Notes (Signed)
 Pt c/o URI symptoms since Wednseday. Reports 70mo son has similar symptoms. Follow up PCP appt Tuesday

## 2024-05-26 NOTE — Discharge Instructions (Signed)
 Your child tested positive for RSV today, you were not tested for COVID/flu/RSV however given that your child did test positive for this it is assumed that you also have RSV as well.  Start Tessalon  and use 1 capsule by mouth every 8 hours as needed for cough.  Continue Tylenol /Motrin  as needed for fever or bodyaches.  Return to the emergency department if your symptoms worsen, keep your follow-up as scheduled with your primary care provider.
